# Patient Record
Sex: Female | Born: 1975 | Race: White | Hispanic: No | Smoking: Former smoker
Health system: Southern US, Community
[De-identification: ages and names within clinical notes are randomized; demographics above are authoritative.]

## PROBLEM LIST (undated history)

## (undated) DIAGNOSIS — D869 Sarcoidosis, unspecified: Secondary | ICD-10-CM

## (undated) DIAGNOSIS — I10 Essential (primary) hypertension: Secondary | ICD-10-CM

## (undated) DIAGNOSIS — F988 Other specified behavioral and emotional disorders with onset usually occurring in childhood and adolescence: Secondary | ICD-10-CM

## (undated) DIAGNOSIS — F419 Anxiety disorder, unspecified: Secondary | ICD-10-CM

## (undated) HISTORY — DX: Anxiety disorder, unspecified: F41.9

## (undated) HISTORY — DX: Essential (primary) hypertension: I10

## (undated) HISTORY — DX: Sarcoidosis, unspecified: D86.9

## (undated) HISTORY — DX: Other specified behavioral and emotional disorders with onset usually occurring in childhood and adolescence: F98.8

---

## 1997-05-05 ENCOUNTER — Other Ambulatory Visit: Admission: RE | Admit: 1997-05-05 | Discharge: 1997-05-05 | Payer: Self-pay | Admitting: Obstetrics and Gynecology

## 1997-12-10 ENCOUNTER — Inpatient Hospital Stay (HOSPITAL_COMMUNITY): Admission: AD | Admit: 1997-12-10 | Discharge: 1997-12-10 | Payer: Self-pay | Admitting: Obstetrics and Gynecology

## 1998-01-08 ENCOUNTER — Inpatient Hospital Stay (HOSPITAL_COMMUNITY): Admission: AD | Admit: 1998-01-08 | Discharge: 1998-01-09 | Payer: Self-pay | Admitting: Obstetrics & Gynecology

## 1998-05-26 ENCOUNTER — Other Ambulatory Visit: Admission: RE | Admit: 1998-05-26 | Discharge: 1998-05-26 | Payer: Self-pay | Admitting: Obstetrics and Gynecology

## 1999-08-01 ENCOUNTER — Other Ambulatory Visit: Admission: RE | Admit: 1999-08-01 | Discharge: 1999-08-01 | Payer: Self-pay | Admitting: Obstetrics and Gynecology

## 2001-04-08 ENCOUNTER — Encounter: Admission: RE | Admit: 2001-04-08 | Discharge: 2001-04-08 | Payer: Self-pay

## 2001-08-08 ENCOUNTER — Other Ambulatory Visit: Admission: RE | Admit: 2001-08-08 | Discharge: 2001-08-08 | Payer: Self-pay | Admitting: Family Medicine

## 2002-03-24 ENCOUNTER — Encounter: Payer: Self-pay | Admitting: Neurology

## 2002-03-24 ENCOUNTER — Ambulatory Visit (HOSPITAL_COMMUNITY): Admission: RE | Admit: 2002-03-24 | Discharge: 2002-03-24 | Payer: Self-pay | Admitting: Neurology

## 2002-05-02 ENCOUNTER — Encounter: Payer: Self-pay | Admitting: Emergency Medicine

## 2002-05-02 ENCOUNTER — Emergency Department (HOSPITAL_COMMUNITY): Admission: EM | Admit: 2002-05-02 | Discharge: 2002-05-03 | Payer: Self-pay | Admitting: Emergency Medicine

## 2002-06-09 ENCOUNTER — Encounter: Admission: RE | Admit: 2002-06-09 | Discharge: 2002-06-09 | Payer: Self-pay | Admitting: Obstetrics and Gynecology

## 2002-07-09 ENCOUNTER — Inpatient Hospital Stay (HOSPITAL_COMMUNITY): Admission: AD | Admit: 2002-07-09 | Discharge: 2002-07-09 | Payer: Self-pay | Admitting: Obstetrics and Gynecology

## 2002-07-28 ENCOUNTER — Inpatient Hospital Stay (HOSPITAL_COMMUNITY): Admission: AD | Admit: 2002-07-28 | Discharge: 2002-07-30 | Payer: Self-pay | Admitting: Obstetrics and Gynecology

## 2002-07-28 ENCOUNTER — Encounter (INDEPENDENT_AMBULATORY_CARE_PROVIDER_SITE_OTHER): Payer: Self-pay | Admitting: Specialist

## 2002-09-03 ENCOUNTER — Other Ambulatory Visit: Admission: RE | Admit: 2002-09-03 | Discharge: 2002-09-03 | Payer: Self-pay | Admitting: Obstetrics and Gynecology

## 2002-12-28 ENCOUNTER — Ambulatory Visit (HOSPITAL_COMMUNITY): Admission: RE | Admit: 2002-12-28 | Discharge: 2002-12-28 | Payer: Self-pay | Admitting: Family Medicine

## 2003-09-24 ENCOUNTER — Other Ambulatory Visit: Admission: RE | Admit: 2003-09-24 | Discharge: 2003-09-24 | Payer: Self-pay | Admitting: Obstetrics and Gynecology

## 2003-09-27 ENCOUNTER — Encounter: Admission: RE | Admit: 2003-09-27 | Discharge: 2003-09-27 | Payer: Self-pay | Admitting: Obstetrics and Gynecology

## 2005-03-20 ENCOUNTER — Ambulatory Visit: Payer: Self-pay | Admitting: Pulmonary Disease

## 2005-03-23 ENCOUNTER — Ambulatory Visit: Payer: Self-pay | Admitting: Pulmonary Disease

## 2005-03-26 ENCOUNTER — Ambulatory Visit: Payer: Self-pay | Admitting: Pulmonary Disease

## 2005-03-26 ENCOUNTER — Encounter (INDEPENDENT_AMBULATORY_CARE_PROVIDER_SITE_OTHER): Payer: Self-pay | Admitting: Specialist

## 2005-03-26 ENCOUNTER — Encounter (INDEPENDENT_AMBULATORY_CARE_PROVIDER_SITE_OTHER): Payer: Self-pay | Admitting: Pulmonary Disease

## 2005-03-26 ENCOUNTER — Ambulatory Visit (HOSPITAL_COMMUNITY): Admission: RE | Admit: 2005-03-26 | Discharge: 2005-03-26 | Payer: Self-pay | Admitting: Pulmonary Disease

## 2005-04-05 ENCOUNTER — Ambulatory Visit: Payer: Self-pay | Admitting: Internal Medicine

## 2005-04-16 ENCOUNTER — Ambulatory Visit: Payer: Self-pay | Admitting: Pulmonary Disease

## 2005-04-24 ENCOUNTER — Encounter (INDEPENDENT_AMBULATORY_CARE_PROVIDER_SITE_OTHER): Payer: Self-pay | Admitting: Specialist

## 2005-04-24 ENCOUNTER — Inpatient Hospital Stay (HOSPITAL_COMMUNITY): Admission: RE | Admit: 2005-04-24 | Discharge: 2005-04-27 | Payer: Self-pay | Admitting: Thoracic Surgery

## 2005-05-02 ENCOUNTER — Encounter: Admission: RE | Admit: 2005-05-02 | Discharge: 2005-05-02 | Payer: Self-pay | Admitting: Thoracic Surgery

## 2005-05-15 ENCOUNTER — Ambulatory Visit: Payer: Self-pay | Admitting: Pulmonary Disease

## 2005-07-27 ENCOUNTER — Encounter (HOSPITAL_COMMUNITY): Admission: RE | Admit: 2005-07-27 | Discharge: 2005-09-28 | Payer: Self-pay | Admitting: Neurology

## 2006-12-31 ENCOUNTER — Ambulatory Visit (HOSPITAL_COMMUNITY): Admission: RE | Admit: 2006-12-31 | Discharge: 2006-12-31 | Payer: Self-pay | Admitting: Obstetrics and Gynecology

## 2007-11-03 ENCOUNTER — Ambulatory Visit (HOSPITAL_COMMUNITY): Admission: RE | Admit: 2007-11-03 | Discharge: 2007-11-03 | Payer: Self-pay | Admitting: Family Medicine

## 2008-04-23 ENCOUNTER — Ambulatory Visit (HOSPITAL_COMMUNITY): Admission: RE | Admit: 2008-04-23 | Discharge: 2008-04-23 | Payer: Self-pay | Admitting: Obstetrics and Gynecology

## 2009-06-28 ENCOUNTER — Encounter
Admission: RE | Admit: 2009-06-28 | Discharge: 2009-06-28 | Payer: Self-pay | Admitting: Physical Medicine and Rehabilitation

## 2009-08-10 ENCOUNTER — Encounter
Admission: RE | Admit: 2009-08-10 | Discharge: 2009-08-10 | Payer: Self-pay | Admitting: Physical Medicine and Rehabilitation

## 2010-02-05 ENCOUNTER — Encounter: Payer: Self-pay | Admitting: Emergency Medicine

## 2010-02-05 ENCOUNTER — Encounter: Payer: Self-pay | Admitting: Family Medicine

## 2010-02-05 ENCOUNTER — Encounter: Payer: Self-pay | Admitting: Thoracic Surgery

## 2010-04-20 ENCOUNTER — Other Ambulatory Visit: Payer: Self-pay | Admitting: Oral Surgery

## 2010-04-20 DIAGNOSIS — M26629 Arthralgia of temporomandibular joint, unspecified side: Secondary | ICD-10-CM

## 2010-04-24 ENCOUNTER — Ambulatory Visit
Admission: RE | Admit: 2010-04-24 | Discharge: 2010-04-24 | Disposition: A | Discharge status: Home / Self Care | Payer: 59 | Source: Ambulatory Visit | Attending: Oral Surgery | Admitting: Oral Surgery

## 2010-04-24 DIAGNOSIS — M26629 Arthralgia of temporomandibular joint, unspecified side: Secondary | ICD-10-CM

## 2010-05-09 ENCOUNTER — Emergency Department (HOSPITAL_COMMUNITY): Payer: 59

## 2010-05-09 ENCOUNTER — Emergency Department (HOSPITAL_COMMUNITY)
Admission: EM | Admit: 2010-05-09 | Discharge: 2010-05-09 | Disposition: A | Discharge status: Home / Self Care | Payer: 59 | Attending: Emergency Medicine | Admitting: Emergency Medicine

## 2010-05-09 DIAGNOSIS — Z79899 Other long term (current) drug therapy: Secondary | ICD-10-CM | POA: Insufficient documentation

## 2010-05-09 DIAGNOSIS — M542 Cervicalgia: Secondary | ICD-10-CM | POA: Insufficient documentation

## 2010-05-09 DIAGNOSIS — D869 Sarcoidosis, unspecified: Secondary | ICD-10-CM | POA: Insufficient documentation

## 2010-05-09 DIAGNOSIS — L03211 Cellulitis of face: Secondary | ICD-10-CM | POA: Insufficient documentation

## 2010-05-09 DIAGNOSIS — R51 Headache: Secondary | ICD-10-CM | POA: Insufficient documentation

## 2010-05-09 DIAGNOSIS — L0201 Cutaneous abscess of face: Secondary | ICD-10-CM | POA: Insufficient documentation

## 2010-05-09 DIAGNOSIS — R220 Localized swelling, mass and lump, head: Secondary | ICD-10-CM | POA: Insufficient documentation

## 2010-05-09 LAB — POCT I-STAT, CHEM 8
BUN: 8 mg/dL (ref 6–23)
Calcium, Ion: 1.11 mmol/L — ABNORMAL LOW (ref 1.12–1.32)
Chloride: 106 mEq/L (ref 96–112)
Creatinine, Ser: 0.8 mg/dL (ref 0.4–1.2)
Glucose, Bld: 83 mg/dL (ref 70–99)
Potassium: 4.1 mEq/L (ref 3.5–5.1)

## 2010-05-09 LAB — CBC
Hemoglobin: 14.1 g/dL (ref 12.0–15.0)
MCH: 29.4 pg (ref 26.0–34.0)
MCHC: 34.8 g/dL (ref 30.0–36.0)
MCV: 84.4 fL (ref 78.0–100.0)
RDW: 12.6 % (ref 11.5–15.5)

## 2010-05-09 LAB — DIFFERENTIAL
Eosinophils Absolute: 0.3 10*3/uL (ref 0.0–0.7)
Eosinophils Relative: 2 % (ref 0–5)
Lymphocytes Relative: 16 % (ref 12–46)
Neutrophils Relative %: 73 % (ref 43–77)

## 2010-05-09 MED ORDER — IOHEXOL 300 MG/ML  SOLN
70.0000 mL | Freq: Once | INTRAMUSCULAR | Status: AC | PRN
  Administered 2010-05-09: 70 mL via INTRAVENOUS

## 2010-05-10 ENCOUNTER — Inpatient Hospital Stay (HOSPITAL_COMMUNITY)
Admission: AD | Admit: 2010-05-10 | Discharge: 2010-05-14 | DRG: 155 | Disposition: A | Discharge status: Home / Self Care | Payer: 59 | Source: Ambulatory Visit | Attending: Family Medicine | Admitting: Family Medicine

## 2010-05-10 DIAGNOSIS — L03211 Cellulitis of face: Secondary | ICD-10-CM | POA: Diagnosis present

## 2010-05-10 DIAGNOSIS — R000 Tachycardia, unspecified: Secondary | ICD-10-CM | POA: Diagnosis present

## 2010-05-10 DIAGNOSIS — K1120 Sialoadenitis, unspecified: Principal | ICD-10-CM | POA: Diagnosis present

## 2010-05-10 DIAGNOSIS — R042 Hemoptysis: Secondary | ICD-10-CM | POA: Diagnosis present

## 2010-05-10 DIAGNOSIS — F17200 Nicotine dependence, unspecified, uncomplicated: Secondary | ICD-10-CM | POA: Diagnosis present

## 2010-05-10 DIAGNOSIS — D869 Sarcoidosis, unspecified: Secondary | ICD-10-CM | POA: Diagnosis present

## 2010-05-10 DIAGNOSIS — K5900 Constipation, unspecified: Secondary | ICD-10-CM | POA: Diagnosis not present

## 2010-05-10 DIAGNOSIS — Z79899 Other long term (current) drug therapy: Secondary | ICD-10-CM

## 2010-05-10 DIAGNOSIS — L0201 Cutaneous abscess of face: Secondary | ICD-10-CM | POA: Diagnosis present

## 2010-05-10 LAB — CBC
HCT: 40.1 % (ref 36.0–46.0)
Hemoglobin: 13.8 g/dL (ref 12.0–15.0)
MCH: 29.4 pg (ref 26.0–34.0)
MCHC: 34.4 g/dL (ref 30.0–36.0)
RBC: 4.69 MIL/uL (ref 3.87–5.11)

## 2010-05-10 LAB — BASIC METABOLIC PANEL
CO2: 24 mEq/L (ref 19–32)
Calcium: 8.5 mg/dL (ref 8.4–10.5)
Chloride: 104 mEq/L (ref 96–112)
Creatinine, Ser: 0.68 mg/dL (ref 0.4–1.2)
Glucose, Bld: 95 mg/dL (ref 70–99)
Sodium: 136 mEq/L (ref 135–145)

## 2010-05-11 ENCOUNTER — Encounter (HOSPITAL_COMMUNITY): Payer: Self-pay | Admitting: Radiology

## 2010-05-11 ENCOUNTER — Inpatient Hospital Stay (HOSPITAL_COMMUNITY): Payer: 59

## 2010-05-11 LAB — BASIC METABOLIC PANEL
BUN: 5 mg/dL — ABNORMAL LOW (ref 6–23)
CO2: 24 mEq/L (ref 19–32)
Calcium: 8.4 mg/dL (ref 8.4–10.5)
Creatinine, Ser: 0.69 mg/dL (ref 0.4–1.2)
GFR calc non Af Amer: >60 mL/min (ref >60)
Glucose, Bld: 151 mg/dL — ABNORMAL HIGH (ref 70–99)
Sodium: 138 mEq/L (ref 135–145)

## 2010-05-11 LAB — CBC
HCT: 35.8 % — ABNORMAL LOW (ref 36.0–46.0)
Hemoglobin: 12.3 g/dL (ref 12.0–15.0)
MCH: 29.4 pg (ref 26.0–34.0)
MCHC: 34.4 g/dL (ref 30.0–36.0)
RDW: 12.7 % (ref 11.5–15.5)

## 2010-05-11 MED ORDER — IOHEXOL 300 MG/ML  SOLN
75.0000 mL | Freq: Once | INTRAMUSCULAR | Status: AC | PRN
  Administered 2010-05-11: 75 mL via INTRAVENOUS

## 2010-05-12 LAB — BASIC METABOLIC PANEL
CO2: 25 mEq/L (ref 19–32)
Calcium: 7.9 mg/dL — ABNORMAL LOW (ref 8.4–10.5)
Creatinine, Ser: 0.72 mg/dL (ref 0.4–1.2)
GFR calc Af Amer: >60 mL/min (ref >60)
GFR calc non Af Amer: >60 mL/min (ref >60)
Sodium: 138 mEq/L (ref 135–145)

## 2010-05-12 LAB — CBC
Hemoglobin: 11.6 g/dL — ABNORMAL LOW (ref 12.0–15.0)
MCH: 28.4 pg (ref 26.0–34.0)
MCHC: 33.3 g/dL (ref 30.0–36.0)
Platelets: 282 10*3/uL (ref 150–400)
RDW: 12.5 % (ref 11.5–15.5)

## 2010-05-13 LAB — VANCOMYCIN, TROUGH: Vancomycin Tr: 8.4 ug/mL — ABNORMAL LOW (ref 10.0–20.0)

## 2010-05-14 LAB — CBC
MCV: 85.8 fL (ref 78.0–100.0)
Platelets: 245 10*3/uL (ref 150–400)
RBC: 3.52 MIL/uL — ABNORMAL LOW (ref 3.87–5.11)
RDW: 12.6 % (ref 11.5–15.5)
WBC: 8.5 10*3/uL (ref 4.0–10.5)

## 2010-05-17 LAB — CULTURE, BLOOD (ROUTINE X 2): Culture  Setup Time: 201204260357

## 2010-05-19 NOTE — Discharge Summary (Signed)
NAMEROSANA, FARNELL NO.:  [id number]  MEDICAL RECORD NO.:  [id number]           PATIENT TYPE:  I  LOCATION:  4505                         FACILITY:  [facility]  PHYSICIAN:  Leighton Roach McDiarmid, M.D.DATE OF BIRTH:  [birth year]  DATE OF ADMISSION:  05/10/2010 DATE OF DISCHARGE:  05/14/2010                              DISCHARGE SUMMARY   PRIMARY CARE PROVIDER:  Dr. Cleta Alberts at [facility].  DISCHARGE DIAGNOSES: 1. Right submandibular sialoadenitis. 2. Sarcoidosis.  DISCHARGE MEDICATIONS: 1. Augmentin 875 mg p.o. b.i.d. times 6 days. 2. MiraLax 1 tablet p.o. daily p.r.n. 3. Cyclobenzaprine 10 mg p.o. t.i.d. 4. Hydrocodone/acetaminophen 5/500 1-2 tablets p.o. q.4-6 hours p.r.n.     pain. 5. Morphine sulfate IR 30 mg p.o. q.4 h. p.r.n. pain. 6. Nabumetone 500 mg p.o. b.i.d. 7. Ortho Tri-Cyclen 1 tablet p.o. daily.  CONSULTS:  Dr. Ezzard Standing with Ears, Nose, and Throat.  PROCEDURES:1. Chest x-ray on April 26, showing no acute abnormalities. 2. Maxillofacial CT showing swelling of the right side of tongue,     right submandibular gland, right parapharyngeal region with edema     in the fat planes and reactive regional lymph nodes.  No sign of     drainable abscess or growth progression.  LABORATORY DATA:  On admission, the patient's CBC showed a white count of 13.5, hemoglobin of 14.3, platelets of 313, ANC count of 9.9.  Her BMET was within normal limits with a creatinine of 0.8.  Blood cultures were drawn and were negative at the time of dictation.  CBC on the day of discharge had improved to a white count of 8.5, hemoglobin of 10.1 and platelets of 245.  BRIEF HOSPITAL COURSE:  This is a 35 year old female with known past medical history of sarcoidosis, presenting with right submandibular swelling with surrounding cellulitis. 1. Right submandibular sialoadenitis.  Initially on admission, the     patient was started on IV vancomycin and Zosyn.  CT head and  neck     showed no drainable abscesses, which was similar to the CT obtained     in the emergency department on April 24 when the patient initially     presented for this right submandibular gland swelling.  The patient     was febrile on admission to 101.5.  T-max during hospitalization     was 102.7.  The patient was afebrile 24 hours prior to discharge.     The patient was continued on IV vancomycin and Zosyn and was given     scheduled Toradol as well as p.r.n. Dilaudid and Tylenol since she     was in significant pain.  Initially was tolerating clears only as     it was difficult for the patient to swallow.  Voice was initially     very garbled, however, this had also improved prior to the day of     discharge and surrounding swelling improved on the right side of     her neck.  ENT was consulted and stated that it appear to be a     right submandibular sialoadenitis with surrounding cellulitis and  they agreed that there was no pocket of infection to drain and/or     culture as per the CT scan.  They recommended continuing broad-     spectrum antibiotics as the most likely organism to be causing this     sialoadenitis and surrounding cellulitis would be staph.  On the     day of discharge, the patient was stating that she felt much better     and was asking to go home.  She was transitioned to p.o. Augmentin     to continue an antibiotic course for treatment of this acute     infection.  Again, the patient had been afebrile for greater than     24 hours with a normalized white blood cell count and improvement     in pain. 2. Sarcoidosis.  The patient did have two episode of hemoptysis prior     to admission and again had one small episode of hemoptysis while in-     house on her first night here.  Repeat chest x-ray showed no active     disease and the patient had no oxygen requirement in her increased     work of breathing, therefore treatment for possible sarcoid flare     was  not started.  No steroids were started.  The patient was     continued on continuous pulse ox in order to assess the need for     oxygen.  Pulmonology was consulted on Sunday April 29 and had     agreed to see the patient on Monday April 60f and they felt that     this could be a sarcoidosis flare, however, the patient strongly     wished to leave on April 29, and thus will follow up with     Pulmonology as an outpatient to decide if any treatment needs to be     done at this time. 3. Tachycardia.  Initially on admission, the patient was tachycardic     into the 120s, however, with pain control and rehydration with IV     fluids.  The patient's tachycardia improved and she was however had     a heart rate in the 70s on the day of discharge.  DISCHARGE INSTRUCTIONS:  The patient was instructed to increase activity slowly, p.o. ad lib as tolerated, insuring that she is getting adequate fluids.  FOLLOWUP: 1. Dr. Cleta Alberts at [facility].  The patient is to call for an     appointment this week. 2. Dr. Ezzard Standing with ENT.  Dr. Allene Pyo office will call with a follow-     up appointment with the patient. 3. Pulmonologist.  The patient is to call her pulmonologist for a     follow-up appointment in the next 1-2 weeks for her sarcoidosis.  DISCHARGE CONDITION:  The patient was discharged home with her husband in stable medical condition, and given warning signs to return to the emergency department for.    ______________________________ Demetria Pore, MD   ______________________________ Leighton Roach McDiarmid, M.D.    JM/MEDQ  D:  05/15/2010  T:  05/16/2010  Job:  [id number]  cc:   Brett Canales A. Cleta Alberts, M.D. Kristine Garbe. Ezzard Standing, M.D.  Electronically Signed by Demetria Pore MD on 05/17/2010 11:43:39 AM Electronically Signed by Acquanetta Belling M.D. on 05/19/2010 11:33:01 AM

## 2010-05-29 NOTE — H&P (Signed)
[name], [name] NO.:  [id number]  MEDICAL RECORD NO.:  [id number]           PATIENT TYPE:  I  LOCATION:  4505                         FACILITY:  [facility]  PHYSICIAN:  Stephanie Compton, MD        DATE OF BIRTH:  Jun 30, 1975  DATE OF ADMISSION:  05/10/2010 DATE OF DISCHARGE:                             HISTORY & PHYSICAL   TIME OF ADMISSION:  10 p.m.  PRIMARY CARE PHYSICIAN:  Stephanie Canales A. Cleta Alberts, MD at [facility].  ORAL AND MAXILLOFACIAL SURGEON:  Stephanie Lopes, MD  PULMONOLOGIST:  At [facility], last visit was 2-3 years ago.  CHIEF COMPLAINT:  Right submandibular swelling and hemoptysis.  HISTORY OF PRESENT ILLNESS:  The patient is a 35 year old female with a known history of sarcoidosis who presents with a 5-day history of submandibular pain and swelling.  She was evaluated in the ED yesterday as the pain and swelling had increased from small 2 x 2 cm to 3 x 4 cm mass by yesterday.  She was found to have a slightly elevated white count at 13.5 and maxillofacial CT result right perimandibular cellulitis that was removed from the TMJ and no evidence of osteo or significant periodontal disease.  She was given clindamycin and Vicodin for treatment of sialadenitis and discharged to home from the ED.  The patient presented to her PCP this morning with complaint of nickel-sized hemoptysis x3 episodes.  She describes coughing up blood clots this morning.  Chest x-ray obtained in the clinic revealed perihilar adenopathy versus infiltrate concerning for sarcoidosis flare.  Of note, the patient has recently had a right TMJ arthroscopy with aspiration 1 week ago.  ALLERGIES:  LYRICA causes patient to have hallucinations.  MEDICATIONS:  Ortho-Tri-Cyclen, and Vicodin.  PAST MEDICAL HISTORY: 1. Sarcoidosis diagnosed in 2007. 2. Right TMJ arthrocentesis last Wednesday.  PAST SURGICAL HISTORY:  Failed BPO in 2008.  SOCIAL HISTORY:  The patient lives with her  husband and two children; ages 58 and 67.  She works as an [employer] fundraiser.  Tobacco;  one pack per day since age 23.  Alcohol, one glass of wine socially.  No drug use.  FAMILY HISTORY:  Her mother is deceased.  Father is deceased.  Siblings noncontributory.  The patient has no family history of respiratory disease or vascular disease.  REVIEW OF SYSTEMS:  GENERAL:  The patient has fever, chills, decreased appetite, fatigue, 5-pound weight loss.  No sweats.  HEENT:  She denies HEENT symptoms.  CARDIAC:  Denies cardiovascular symptoms.  No chest pain.  No edema.  No palpitations.  RESPIRATORY:  She reports cough, but no dyspnea.  No wheezing.  No sputum production.  She reports hemoptysis, three episodes this morning, now resolved.  GI:  The patient denies GI symptoms.  GU:  Denies.  SKIN:  Denies.  MUSCULOSKELETAL: Denies.  NEURO:  Denies.  HEME:  Denies.  ENDO:  Denies.  PHYSICAL EXAMINATION:  VITAL SIGNS:  Temperature 100.2, pulse 120, respiratory rate 18, blood pressure 113/75, saturating 92% on room air. GENERAL:  The patient is awake, alert, no acute distress.  She has muffled speech. HEENT:  Normocephalic and atraumatic.  Pupils equal, round, and reactive to light and accommodating.  Tympanic membranes were without erythema or edema bilaterally.  Mouth opens 2.5 cm.  She has 3 x 5 cm right submandibular swelling without erythema.  It is tender to palpation, localized.  She has dry mucous membranes.  No oral lesions.  Her tongue does appear cracked and possibly swollen.  She has on bimanual exam, there is no tenderness of buccal mucosa. NECK:  Supple, full range of motion.  No cervical lymphadenopathy. CARDIOVASCULAR:  S1 and S2, regular rate and rhythm.  No murmurs, rubs, or gallops. LUNGS:  Normal work of breathing, scattered expiratory wheezes bilaterally.  No crackles. No rhonchi. ABDOMEN:  Normoactive bowel sounds, soft, nontender. EXTREMITIES:  She has 5/5 strength in bilateral upper  extremities. NEUROLOGIC:  Cranial nerves II through XII are intact.  Normal muscle tone.  The patient has no labs or studies.  No labs from today.  Her CMET yesterday was obtained in the ED and was within normal limits.  CBC was only notable for a white count of 13.5, ANC of 9.9.  Chest x-ray obtained at [facility] shows no perihilar infiltrate.  ASSESSMENT AND PLAN:  A 34 year old female with a known history of sarcoidosis presenting with 5 days of right submandibular swelling and 1- day history of hemoptysis. 1. Hemoptysis, resolved.  The patient does have new infiltrates on     chest x-ray concerning for sarcoidosis flare, but since her     respiratory status is stable with normal work of breathing, normal     O2 sat, I will hold off starting steroid course.  Plan to closely     monitor the patient with continued pulse ox, may give p.r.n.     albuterol nebs.  The patient has increased wheezing. 2. Right submandibular swelling, likely sialadenitis secondary to     bacterial infection given the patient has an increased white blood     cell count with neutrophils predominance.  Plan to obtain blood     cultures x2 and start IV vanc and Zosyn.  I have also considered     extrapulmonary sarcoidosis, but this is unlikely because     extrapulmonary sarcoidosis with facial gland swelling is usually     characterized by bilateral pain with swelling of the parotid gland.     There is no evidence of osteo or right TMJ involvement on the     patient's CT scan given that she does have recent manipulation of     that area by OMFS last week.  Plan is to treat with IV vanc and     Zosyn, start morphine and Toradol for pain control. 3. Fluids, electrolytes, nutrition/gastrointestinal.  We will check     the patient's electrolytes today.  We will start clear liquid diet     and IV fluids given the patient's losses and her poor p.o. intake     over the last few days and to     make up for  poor p.o. intake over the last few days. 4. Deep venous thrombosis prophylaxis.  Lovenox 40 subcu daily. 5. Disposition.  Pending clinical improvement, the patient will be     discharged to home.    ______________________________ Dessa Phi, MD   ______________________________ Stephanie Compton, MD    JF/MEDQ  D:  05/10/2010  T:  05/11/2010  Job:  [id number]  Electronically Signed by Dessa Phi MD on 05/26/2010 03:27:49 PM Electronically Signed by  Stephanie Compton MD on 05/29/2010 04:39:13 PM

## 2010-05-30 NOTE — Op Note (Signed)
[name], [name] NO.:  [id number]   MEDICAL RECORD NO.:  [id number]          PATIENT TYPE:  AMB   LOCATION:  SDC                           FACILITY:  [facility]   PHYSICIAN:  Randye Lobo, M.D.   DATE OF BIRTH:  [birth year]   DATE OF PROCEDURE:  12/31/2006  DATE OF DISCHARGE:                               OPERATIVE REPORT   PREOPERATIVE DIAGNOSIS:  Multiparous female, desires permanent  sterilization.   POSTOPERATIVE DIAGNOSIS:  Multiparous female, desires permanent  sterilization.   PROCEDURE:  Laparoscopic bilateral tubal ligation by fulguration.   SURGEON:  Conley Simmonds, M.D.   ANESTHESIA:  General endotracheal, local with 0.25% Marcaine.   IV FLUIDS:  2000 mL Ringer's lactate.   ESTIMATED BLOOD LOSS:  Minimal.   URINE OUTPUT:  350 mL.   COMPLICATIONS:  None.   INDICATIONS FOR PROCEDURE:  The patient is a 35 year old para 2  Caucasian female who desires permanent sterilization.  The plan is to  proceed with a laparoscopic bilateral tubal ligation with bipolar  cautery after risks, benefits, and alternatives are reviewed.  The  patient understands that there is a failure rate of the procedure of  approximately 1 in 250 to 1 in 300 which may result in either an  intrauterine or an ectopic pregnancy.   FINDINGS:  The patient had a normal appearing uterus, bilateral tubes,  bilateral ovaries, liver, gallbladder, stomach organ, and appendix.  There was no evidence of any adhesive disease nor endometriosis in the  abdomen or the pelvis.   SPECIMENS:  None.   PROCEDURE:  The patient was reidentified in the preoperative hold area.  She did receive Ancef 1 gram IV for antibiotic prophylaxis.  In the  operating room, general endotracheal anesthesia was induced, and the  patient was then placed in the dorsal lithotomy position.  The abdomen  and vagina were sterilely prepped and the bladder was catheterized of  urine.  A speculum was placed inside the  vagina, and a single-tooth  tenaculum was placed on the anterior cervical lip.  This was replaced  with a Hulka tenaculum, and the remaining vaginal instruments were  removed.  The patient was then sterilely draped.   The procedure began by creating a 1-cm umbilical incision with a  scalpel.  The incision was carried down to the fascia using blunt  dissection with an Allis clamp.  A 10-mm trocar was then inserted  directly into the peritoneal cavity without difficulty.  The laparoscope  confirmed proper placement.  A CO2 pneumoperitoneum was achieved, and  the patient was then placed in the Trendelenburg position.   A 5-mm suprapubic incision was created 2 fingerbreadths above the pubic  symphysis.  A 5-mm trocar was inserted directly under visualization of  the laparoscope.  An inspection of the pelvic and abdominal organs was  performed, and the findings are as noted above.   The bipolar Kleppinger forceps was then used to grasp the right  fallopian tube and followed all the way to its fimbriated end.  It was  then grasped along the isthmic portion and was fulgurated over a  total  distance of 3 cm.  There was good burn effect into the mesosalpinx.  The  same procedure that was performed on the right fallopian tube was then  repeated on the left fallopian tube after it was grasped and followed  all the way to its fimbriated end.  Again, a 3-cm segment of fallopian  tube including mesosalpinx was fulgurated.   This concluded the patient's procedure.  The 5-mm suprapubic trocar was  removed under visualization of the laparoscope.  The CO2  pneumoperitoneum was released, and then the 10-mm umbilical trocar and  laparoscope were removed simultaneously.   The skin incisions were closed with inverted subcuticular sutures of 3-0  plain gut suture.  The skin was then locally injected with 0.25%  Marcaine at each of the incision sites.  The incisions were covered  sterilely.   The Hulka  tenaculum was removed.  The patient was awakened and extubated  and escorted to the recovery room in stable and awake condition.  There  were no complications to the procedure.  All needle, instrument, and  sponge counts were correct.      Randye Lobo, M.D.  Electronically Signed     BES/MEDQ  D:  12/31/2006  T:  12/31/2006  Job:  [id number]

## 2010-06-02 NOTE — Op Note (Signed)
NAMESHEREESE, [name] NO.:  [id number]   MEDICAL RECORD NO.:  [id number]          PATIENT TYPE:  AMB   LOCATION:  ENDO                         FACILITY:  [facility]   PHYSICIAN:  Danice Goltz, M.D. LHCDATE OF BIRTH:  [birth year]   DATE OF PROCEDURE:  03/26/2005  DATE OF DISCHARGE:                                 OPERATIVE REPORT   PROCEDURE:  Bronchoscopy.   HISTORY:  This is a 35 year old white female who has been evaluated at  [facility] as recently as March 20, 2005, with subsequent follow-up  on March 23, 2005.  The patient has been kindly referred by Dr. Earl Lites  for evaluation of hemoptysis and abnormal CT.  The patient was found to have  bilateral pulmonary infiltrates. 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 She was given a prednisone taper as well  as Levaquin therapy.  Because of lack of resolution of her chest  x-ray, we  proceeded with bronchoscopy.   CONSENT AND PREOPERATIVE PREPARATION:  Bronchoscopy was discussed with the  patient including limitations, potential complications and benefits of the  same; she agreed to proceed. After informed consent was signed, the patient  was taken to the endoscopy suite where she was premedicated with a total of  7 mg of Versed and 100 mcg of fentanyl IV. The patient also received  lidocaine via bronchial lavage.  She also had Cetacaine local anesthetic as  well.   DESCRIPTION OF PROCEDURE:  The bronchoscope was then advanced via the oral  route. The vocal cords had evidence of reflux injury, with no inflammatory  changes suggestive of the same.  The trachea had diffuse inflammation with  mild pinpoint hemorrhage. The carina was sharp.  At this point lidocaine was  instilled in both mainstem bronchi, and on the individual subsegments..  Entering the right mainstem bronchus, the right upper lobe was noted to have  no endobronchial lesions. She had chronic diffuse  inflammation, and numerous  small mucous plugs and mucoid secretions. The bronchoscope was then advanced  on the right middle lobe and right lower lobe subsegments, and again the  same findings were noted. At this point, the bronchoscope was brought up  through the left, and the exact same findings were noted on the left upper  lobe, lingula and lower lobe subsegments. There was no obvious source of  true hemoptysis, with the exception of the inflammatory changes noted above.  At this point, the bronchoscope was brought up through the right middle  lobe, where lavage was done.  An initial 10 cc aliquot was done with normal  saline; this was suctioned, traps were changed, and subsequent lavage was  done with 2 aliquots of 20 cc of normal saline -- yielding 25 cc of BAL. At  this point, the trap was changed and saved for pathology and T-cell subset.   At this point, the bronchoscope was brought to the right lower lobe  subsegments, and under fluoroscopy the patient had 4 transbronchial biopsies  done; with no obvious complications noted. The chest x-ray at this point is  pending.   Blood loss was negligible.   After proper hemostasis was noted, the bronchoscope was pulled out and the  procedure was terminated. The patient was taken to the recovery area in  satisfactory condition.   IMPRESSION:  HEMOPTYSIS AND BILATERAL PULMONARY INFILTRATES.  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  The Plan will  be to await pathology and cultures.  The patient's bronchial washings were  also checked and sent for T-cell subsets.  We will await the results of  these.   FOLLOW-UP:  Will be as previously scheduled in 2-3 days time a [facility]           ______________________________  Danice Goltz, M.D. [facility]     LG/MEDQ  D:  03/26/2005  T:  03/27/2005  Job:  [id number]   cc:   Brett Canales A. Cleta Alberts, M.D.   Fax: [phone]

## 2010-06-02 NOTE — Discharge Summary (Signed)
[name], [name] NO.:  [id number]   MEDICAL RECORD NO.:  [id number]          PATIENT TYPE:  INP   LOCATION:  3304                         FACILITY:  [facility]   PHYSICIAN:  Ines Bloomer, M.D. DATE OF BIRTH:  [birth year]   DATE OF ADMISSION:  04/24/2005  DATE OF DISCHARGE:  04/26/2005                                 DISCHARGE SUMMARY   PRIMARY DIAGNOSIS:  Right upper lobe, left lower lobe and left upper lobe  infiltrates; shows granulomatous inflammation, __________ granulomas typical  of sarcoidosis.   IN HOSPITAL OPERATIONS AND PROCEDURES:  Left video-assisted thoracoscopic  surgery with lung biopsy x3.   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  Chest x-ray  showed bilateral pulmonary infiltrates.  She was seen and CT scan was then  ordered, which showed mediastinal and hilar adenopathy with extensive  infiltrates to the left lower lobe, right middle lobe and right upper lobe,  and was relatively sparing of the lingula and right middle lobe.  A small  effusion was seen on the right.  The patient was started on steroids and  antibiotics at that time.  Repeat chest x-ray did show some involvement in  the lingula.  Bronchoscopy was then done, which was nondiagnostic.  Followup  chest x-ray then showed some partial clearing, but still has a left lower  lobe infiltrate.  The patient was seen and evaluated by Dr. Edwyna Shell.  Dr.  Edwyna Shell discussed with patient taking her to the operating room to undergo a  VATS, and biopsy of the left lung and possible right lung.  He discussed the  risks and benefits of this with the patient.  The patient acknowledged her  understanding and agreed to proceed.  Surgery was scheduled for April 24, 2005.   The patient was taken to the operating room on April 24, 2005 where she  underwent left video-assisted thoracoscopic surgery with lung  biopsy x3.  Pathology results came back showing granulomatous inflammation with pattern  of granulomas typical of sarcoidosis.  Following surgery, the patient was  transferred to the intensive care unit.  She tolerated the procedure well.  Following surgery, she was seen to be alert and oriented x3, and neuro  intact.  The patient's postoperative course was pretty much unremarkable.  Chest tubes were discontinued posterior day 1 and anterior postop day 2.  Follow-up chest x-ray after second chest tubediscontinuedshowed to be  stable.  No pneumothorax noted.  The patient was encouraged to use her  incentive spirometer daily.  Vital signs were monitored and remained stable  postoperatively.  She was able to be weaned off oxygen satting greater than  90% on room air.  The patient was seen to be hemodynamically stable  following surgery with hemoglobin and hematocrit to 11 and 34.  The  patient's incisions were dry, intact and healing well.  Postoperatively, she  was out of bed and ambulating well.  She was tolerating a regular diet well  with no nausea or vomiting.  The patient's  pain was slightly controlled with  p.o. medications.   The patient is tentatively ready for discharge home on postop day 3, April 27, 2005.  The patient is in stable condition at discharge.  A follow-up  appointment has been scheduled with Dr. Edwyna Shell for May 02, 2005 at 3:50  p.m.  The patient is to obtain an AP and lateral chest x-ray 1 hour prior to  this appointment.  Ms. Maines received instructions on diet, activity level  and incisional care.  She was told no driving until released to do so and no  heavy lifting over 10 pounds.  The patient is told she is allowed to shower,  washing her incisions using soap and water.  She is to contact the office if  she develops any drainage or opening from any of her incision sites.  The  patient was told to ambulate 3-4 times per day, progress as tolerated and to   continue her breathing exercises.  She was educated on diet to be low-fat  and low-salt.  The patient acknowledged her understanding.   DISCHARGE MEDICATIONS:  1.  Percocet 5/325 mg 1-2 tabs q.4-6h. p.r.n. pain.  2.  Provigil 400 mg daily.  3.  Nicotine patch over-the-counter.  4.  Ortho Cyclen daily.      Theda Belfast, PA    ______________________________  Ines Bloomer, M.D.    KMD/MEDQ  D:  04/26/2005  T:  04/26/2005  Job:  [id number]

## 2010-06-02 NOTE — Op Note (Signed)
NAMERISHITA, PETRON NO.:  [id number]   MEDICAL RECORD NO.:  [id number]           PATIENT TYPE:   LOCATION:                               FACILITY:  [facility]   PHYSICIAN:  Genene Churn. Love, M.D.    DATE OF BIRTH:  Apr 30, 1975   DATE OF PROCEDURE:  07/27/2005  DATE OF DISCHARGE:                                 OPERATIVE REPORT   PROCEDURE NOTE:  The patient was prepped and draped in the left lateral  position using Betadine and 1% Xylocaine.  The L3-L4 interspace was entered  without difficulty.  Opening pressure was 250, but the patient was anxious.  CSF was obtained and sent for studies; this was clear and colorless.  Studies to be obtained are RPR, protein, glucose, cell count, differential,  angiotensin-converting enzyme, IgG and oligoclonal IgG.  The patient  tolerated the procedure well.           ______________________________  Genene Churn. Sandria Manly, M.D.     JML/MEDQ  D:  07/27/2005  T:  07/27/2005  Job:  [id number]

## 2010-06-02 NOTE — H&P (Signed)
[name], ESPEY NO.:  [id number]   MEDICAL RECORD NO.:  [id number]          PATIENT TYPE:  INP   LOCATION:  NA                           FACILITY:  [facility]   PHYSICIAN:  Ines Bloomer, M.D. DATE OF BIRTH:  11-01-1975   DATE OF ADMISSION:  DATE OF DISCHARGE:                                HISTORY & PHYSICAL   CHIEF COMPLAINT:  Pulmonary infiltrates.   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 Chest x-ray showed bilateral pulmonary  infiltrates. She was seen and CT scan was done which showed mediastinal and  hilar adenopathy with extensive infiltrates of the left lower lobe, right  middle lobe and right upper lobe and was relatively sparing of the lingula  and right middle lobe. A small effusion was seen on the right. She was  started on steroids and antibiotics.  Repeat chest x-ray did show some  involvement of the lingula. A bronchoscopy was done and was non diagnostic.  A follow up chest x-ray did show some partial clearing but still had the  left lower lobe infiltrate and has been admitted for left lung biopsy before  continuing either antibiotics or Prednisone.   ALLERGIES:  None.   MEDICATIONS:  Ortho-Cyclen daily and __________  50 mg daily.   FAMILY HISTORY:  Negative for vascular disease and cancer.   PAST MEDICAL HISTORY:  No major medical problems.   SOCIAL HISTORY:  She is married and has two children. Works as an [employer]  Smokes 1/4 pack of cigarettes a day. Does not drink alcohol.   REVIEW OF SYSTEMS:  She has some recent weight loss and loss of appetite and  some intermittent fevers. Weight is 182 pounds. She is 5 feet, 5 inches.  CARDIAC:  Denies angina or atrial fibrillation. PULMONARY: She has had a  cough, has also had bronchitis and hemoptysis. No wheezing. GI:  She has  been treated for a hiatal hernia and peptic ulcer disease. GU: She denies  dysuria, frequency or  kidney disease. VASCULAR:  Denies claudication, DVT or  TIA.  NEUROLOGIC:  She has chronic headaches. No black outs or seizure.  ORTHOPEDIC:  Denies joint pain.  PSYCHIATRIC:  No psychiatric illness. ENT:  She has photosensitivity but no hearing problems. HEMATOLOGY:  She has no  problem with bleeding or anemia.   PHYSICAL EXAMINATION:  GENERAL:  She is a well-developed Caucasian female in  no acute distress.  VITAL SIGNS:  Pulmonary function tests showed an FVC of 2.87, FEV1 of 2.62.  Blood pressure is 118/74, pulse 100, respirations are 18 and saturations are  96%.  HEENT:  Head is atraumatic. Eyes - pupils are equal, round and reactive to  light and accommodation. Extraocular movements are normal. Ears - tympanic  membranes intact. Nose with no septal deviation. Throat without lesion,  uvula is in the midline.  NECK:  No thyromegaly. No carotid bruits. No supraclavicular adenopathy.  CHEST:  Clear to auscultation and percussion. Did not hear any wheezes.  HEART:  Regular sinus rhythm, no murmurs.  ABDOMEN:  Soft with no hepatosplenomegaly.  EXTREMITIES:  Pulses are 2+, there is no clubbing or edema.  NEUROLOGIC:  She is oriented x3. Cranial nerves are intact. Sensory and  motor intact.  SKIN:  Without lesion.   IMPRESSION:  Bilateral pulmonary infiltrate. Rule out cancer. Rule out  inflammatory lung disease. Rule out atypical pneumonia.   PLAN:  __________ Lung biopsy x2.           ______________________________  Ines Bloomer, M.D.     DPB/MEDQ  D:  04/19/2005  T:  04/19/2005  Job:  [id number]

## 2010-06-02 NOTE — Op Note (Signed)
NAME:  [name], [name]                      ACCOUNT NO.:  [id number]   MEDICAL RECORD NO.:  [id number]                   PATIENT TYPE:  INP   LOCATION:  9125                                 FACILITY:  [facility]   PHYSICIAN:  Randye Lobo, M.D.                DATE OF BIRTH:  12-Jun-1975   DATE OF PROCEDURE:  07/28/2002  DATE OF DISCHARGE:                                 OPERATIVE REPORT   PREOPERATIVE DIAGNOSES:  1. Intrauterine gestation at 37 weeks.  2. Gestational diabetes mellitus, diet controlled.  3. Polyhydramnios.  4. Nonreassuring fetal assessment.   POSTOPERATIVE DIAGNOSES:  1. Intrauterine gestation at 37 weeks.  2. Gestational diabetes mellitus, diet controlled.  3. Polyhydramnios.  4. Nonreassuring fetal assessment.   PROCEDURE:  Vacuum-assisted vaginal delivery with repair of second-degree  laceration of the perineum.   SURGEON:  Randye Lobo, M.D.   ANESTHESIA:  Local, 1% lidocaine.   ESTIMATED BLOOD LOSS:  400 mL.   URINE OUTPUT:  50 mL.   COMPLICATIONS:  None.   INDICATIONS FOR PROCEDURE:  The patient is a 35 year old gravida 2, para 0-1-  0-1, Caucasian female who presented at 74 weeks' gestation to the [facility] reporting spontaneous rupture of membranes with clear  fluid at 2:30.  When the patient was examined in maternity admission she was  noted to be 7-8 cm with an urge to push and she was taken immediately down  to labor and delivery.  Initially the patient began pushing.  When the  examiner arrived, the patient was found to have an anterior lip and the  patient was told to stop pushing.  She did receive Stadol 2 mg intravenously  at this time.  A fetal scalp electrode was placed to improve continuous  monitoring.  After the fetal scalp electrode was placed, the fetal heart  rate tracing was reassuring with accelerations.   The patient's antepartum course was significant for gestational diabetes  mellitus which was diet  controlled.  The patient had an ultrasound performed  approximately two weeks prior documenting polyhydramnios and growth which  was appropriate for gestational age.  Of note, a prior ultrasound had  suggested a two-vessel umbilical cord.   The patient went on to achieve complete dilation of the cervix.  With some  pushing efforts, there was some urine which was emptied from the bladder  spontaneously.  A sterile I&O catheterization was performed to drain the  remaining 50 mL from within the bladder.  As the patient developed pushing,  severe decelerations of the fetal heart rate tracing were present.  The  recommendation was made by the obstetrician to proceed with a vacuum-  assisted vaginal delivery.  The patient agreed to proceed.   FINDINGS:  A viable female was delivered at 4:44 a.m. over a second-degree  perineal laceration.  Apgars were 9 at one minute and 9 at five minutes.  The  infant was vigorous at birth.  The cord pH was noted to be 7.23.   The placenta was intact and had a marginal insertion of a two-vessel cord.   PROCEDURE:  The patient had an IV in place.  The patient was examined and  the vertex was noted to be in the ROA position at the 2+ station.  The  Mityvac was placed to the vertex.  It was used over two contractions with no  pop-offs.  The newborn did deliver on the second contraction.  There was no  shoulder dystocia.  After the infant was delivered the cord was doubly  clamped and cut and the newborn was taken immediately over to the  pediatricians who were in attendance.  The cord pH was obtained and was  noted to be 7.23.   The placenta was delivered spontaneously.  The cervix and vagina were  examined and demonstrated no lacerations.  There was a second-degree  perineal laceration which was repaired with 1% lidocaine and 2-0 Vicryl  Rapide suture.  The patient did receive Pitocin 20 units intravenously.  There were no complications of the procedure.  All  sponge, instrument, and  needle counts were correct.                                               Randye Lobo, M.D.    BES/MEDQ  D:  07/28/2002  T:  07/28/2002  Job:  [id number]

## 2010-06-02 NOTE — Op Note (Signed)
NAMEKATERIN, NEGRETE NO.:  [id number]   MEDICAL RECORD NO.:  [id number]          PATIENT TYPE:  INP   LOCATION:  2550                         FACILITY:  [facility]   PHYSICIAN:  Ines Bloomer, M.D. DATE OF BIRTH:  02/17/1975   DATE OF PROCEDURE:  DATE OF DISCHARGE:                                 OPERATIVE REPORT   PREOPERATIVE DIAGNOSIS:  Right upper lobe, left lower lobe and left upper  lobe infiltrate.   POSTOPERATIVE DIAGNOSIS:  Right upper lobe, left lower lobe and left upper  lobe infiltrate.   OPERATION PERFORMED:  Left video assisted thoracoscopic surgery, lung biopsy  x3/   SURGEON:  Ines Bloomer, M.D.   ASSISTANT:  Pecola Leisure, PA   ANESTHESIA:  General.   DESCRIPTION OF PROCEDURE:  After percutaneous insertion of all monitoring  lines, the patient underwent general anesthesia.  She was turned to the left  lateral thoracotomy position and a dual lumen tube was inserted.  The left  lung was deflated.  She was prepped and draped in the usual sterile manner.  Two trocar sites were made, one in the midaxillary line, the eighth  intercostal space and one in the anterior axillary line at the sixth  intercostal space.  Two trocars were inserted.  A 2 degree scope was  inserted and pictures were taken of a process that involved the left  superior segment and went down to the left superior segment of the left  lower lobe and kind of the posterior segment of the left upper lobe where  there was a dense consolidate.  A third trocar site was made at the fifth  intercostal space at the midaxillary line, about 2 to 3 cm, enough to get a  finger in to palpate this dense adhesion.  Then using 1S and EZ-45 stapler,  we first took a small biopsy of the left superior segment.  We could not get  a big one because it was so consolidated.  We saved part of this for culture  and the rest for frozen section.  Then we looked at the lingula which had  some  lingular sparing but at the base of the lingula there was some  involvement, so we did a biopsy across the base of the lingula resecting  about 2 to 3 cm of lingula and it was obviously involved.  Then finally, we  got the medial basilar segment which was involved and resected a 2 to 3 cm  piece of the medial basilar segment with the EZ-45 stapler in multiple  applications.  All bleeding was electrocoagulated.  Two chest tubes were  placed through the anterior and posterior trocar sites, tied in place.  The  superior trocar site was closed with interrupted #1 Vicryl, 2-0 Vicryl in  the subcutaneous tissue and Dermabond for the skin.  An On-Q catheter was  placed in the usual  fashion subpleurally, Marcaine block was done in the usual fashion.  The  patient was returned to recovery room in stable condition.  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  ______________________________  Ines Bloomer, M.D.     DPB/MEDQ  D:  04/24/2005  T:  04/24/2005  Job:  [id number]   cc:   Danice Goltz, M.D. [facility]  [address] [place], [place] [address]

## 2010-06-22 NOTE — Consult Note (Signed)
  NAMEJULENA, BARBOUR NO.:  [id number]  MEDICAL RECORD NO.:  [id number]           PATIENT TYPE:  I  LOCATION:  4505                         FACILITY:  [facility]  PHYSICIAN:  Kristine Garbe. Ezzard Standing, M.D.DATE OF BIRTH:  1975/09/28  DATE OF CONSULTATION:  05/11/2010 DATE OF DISCHARGE:                                CONSULTATION   REASON FOR CONSULTATION:  Evaluate the patient with right neck infection, questionable abscess.  BRIEF HISTORY:  [name] is a 35 year old female with history of sarcoid who presented to the hospital with about a 3-day history of right facial and right neck swelling.  She previously had a steroid injection in the right TMJ joint about a week ago.  She initially saw her local physician and was started on Cleocin on Tuesday.  She had a CT scan, which showed a large right submandibular gland.  Her symptoms progressed with increasing swelling, low-grade fever, and the patient was subsequently admitted to the hospital the following day on Wednesday and started on IV antibiotics.  The patient has been having difficulty chewing and swallowing.  She gives no previous history of submandibular gland infections or neck infection.  She denies any dental infections. She had a CT scan of her neck today, which shows swelling in the right submandibular area with a large right submandibular gland and submental swelling, but no drainable abscess noted on the CT scan.  On bedside examination, the patient has diffuse swelling of the right submandibular area that extends up into the submental region.  Mouth is actually reasonably soft, has a little bit of swelling, could not elicit any purulent drainage from the submandibular duct, and could not obtain culture from the duct region.  IMPRESSION:  Right submandibular sialadenitis with surrounding cellulitis.  Blood cultures are pending.  Recommend broad-spectrum IV antibiotics, good hydration with p.o.  fluids, most common bacteria and sialadenitis with the staph.  We will follow up the patient during hospitalization.          ______________________________ Kristine Garbe Ezzard Standing, M.D.     CEN/MEDQ  D:  05/11/2010  T:  05/12/2010  Job:  [id number]  Electronically Signed by Dillard Cannon M.D. on 06/22/2010 11:30:50 AM

## 2010-07-11 ENCOUNTER — Emergency Department (HOSPITAL_COMMUNITY): Payer: 59

## 2010-07-11 ENCOUNTER — Emergency Department (HOSPITAL_COMMUNITY)
Admission: EM | Admit: 2010-07-11 | Discharge: 2010-07-11 | Disposition: A | Discharge status: Home / Self Care | Payer: 59 | Attending: Emergency Medicine | Admitting: Emergency Medicine

## 2010-07-11 DIAGNOSIS — R42 Dizziness and giddiness: Secondary | ICD-10-CM | POA: Insufficient documentation

## 2010-07-11 DIAGNOSIS — M542 Cervicalgia: Secondary | ICD-10-CM | POA: Insufficient documentation

## 2010-07-11 DIAGNOSIS — D869 Sarcoidosis, unspecified: Secondary | ICD-10-CM | POA: Insufficient documentation

## 2010-07-11 DIAGNOSIS — R220 Localized swelling, mass and lump, head: Secondary | ICD-10-CM | POA: Insufficient documentation

## 2010-07-11 DIAGNOSIS — K1120 Sialoadenitis, unspecified: Secondary | ICD-10-CM | POA: Insufficient documentation

## 2010-07-11 DIAGNOSIS — R51 Headache: Secondary | ICD-10-CM | POA: Insufficient documentation

## 2010-07-11 DIAGNOSIS — J320 Chronic maxillary sinusitis: Secondary | ICD-10-CM | POA: Insufficient documentation

## 2010-07-11 LAB — COMPREHENSIVE METABOLIC PANEL
AST: 15 U/L (ref 0–37)
Albumin: 3.4 g/dL — ABNORMAL LOW (ref 3.5–5.2)
Alkaline Phosphatase: 71 U/L (ref 39–117)
CO2: 25 mEq/L (ref 19–32)
Chloride: 103 mEq/L (ref 96–112)
GFR calc non Af Amer: >60 mL/min (ref >60)
Potassium: 3.9 mEq/L (ref 3.5–5.1)
Total Bilirubin: 0.4 mg/dL (ref 0.3–1.2)

## 2010-07-11 LAB — CBC
HCT: 41.3 % (ref 36.0–46.0)
Platelets: 307 10*3/uL (ref 150–400)
RBC: 4.79 MIL/uL (ref 3.87–5.11)
RDW: 13.8 % (ref 11.5–15.5)
WBC: 11.9 10*3/uL — ABNORMAL HIGH (ref 4.0–10.5)

## 2010-07-11 LAB — DIFFERENTIAL
Basophils Absolute: 0.1 10*3/uL (ref 0.0–0.1)
Eosinophils Absolute: 0.1 10*3/uL (ref 0.0–0.7)
Eosinophils Relative: 1 % (ref 0–5)
Lymphocytes Relative: 14 % (ref 12–46)
Lymphs Abs: 1.7 10*3/uL (ref 0.7–4.0)
Neutrophils Relative %: 78 % — ABNORMAL HIGH (ref 43–77)

## 2010-07-11 LAB — URINALYSIS, ROUTINE W REFLEX MICROSCOPIC
Nitrite: NEGATIVE
Specific Gravity, Urine: 1.022 (ref 1.005–1.030)
Urobilinogen, UA: 0.2 mg/dL (ref 0.0–1.0)
pH: 7 (ref 5.0–8.0)

## 2010-07-11 MED ORDER — IOHEXOL 300 MG/ML  SOLN
100.0000 mL | Freq: Once | INTRAMUSCULAR | Status: AC | PRN
  Administered 2010-07-11: 100 mL via INTRAVENOUS

## 2010-07-12 LAB — URINE CULTURE: Culture: NO GROWTH

## 2010-10-23 LAB — CBC
Hemoglobin: 14.1
RBC: 4.8
WBC: 9.3

## 2010-10-26 ENCOUNTER — Other Ambulatory Visit: Payer: Self-pay | Admitting: Otolaryngology

## 2010-11-16 ENCOUNTER — Other Ambulatory Visit: Payer: Self-pay | Admitting: Emergency Medicine

## 2010-11-16 DIAGNOSIS — R413 Other amnesia: Secondary | ICD-10-CM

## 2010-11-19 ENCOUNTER — Ambulatory Visit
Admission: RE | Admit: 2010-11-19 | Discharge: 2010-11-19 | Disposition: A | Discharge status: Home / Self Care | Payer: 59 | Source: Ambulatory Visit | Attending: Emergency Medicine | Admitting: Emergency Medicine

## 2010-11-19 DIAGNOSIS — R413 Other amnesia: Secondary | ICD-10-CM

## 2010-11-19 MED ORDER — GADOBENATE DIMEGLUMINE 529 MG/ML IV SOLN
15.0000 mL | Freq: Once | INTRAVENOUS | Status: AC | PRN
  Administered 2010-11-19: 15 mL via INTRAVENOUS

## 2011-01-26 ENCOUNTER — Ambulatory Visit (INDEPENDENT_AMBULATORY_CARE_PROVIDER_SITE_OTHER): Payer: 59

## 2011-01-26 DIAGNOSIS — J99 Respiratory disorders in diseases classified elsewhere: Secondary | ICD-10-CM

## 2011-01-26 DIAGNOSIS — D234 Other benign neoplasm of skin of scalp and neck: Secondary | ICD-10-CM

## 2011-01-26 DIAGNOSIS — L98499 Non-pressure chronic ulcer of skin of other sites with unspecified severity: Secondary | ICD-10-CM

## 2011-01-26 DIAGNOSIS — R05 Cough: Secondary | ICD-10-CM

## 2011-01-29 ENCOUNTER — Ambulatory Visit (INDEPENDENT_AMBULATORY_CARE_PROVIDER_SITE_OTHER): Payer: 59

## 2011-01-29 DIAGNOSIS — R05 Cough: Secondary | ICD-10-CM

## 2011-01-29 DIAGNOSIS — J9801 Acute bronchospasm: Secondary | ICD-10-CM

## 2011-01-29 DIAGNOSIS — J99 Respiratory disorders in diseases classified elsewhere: Secondary | ICD-10-CM

## 2011-01-29 DIAGNOSIS — R059 Cough, unspecified: Secondary | ICD-10-CM

## 2011-02-19 ENCOUNTER — Telehealth: Payer: Self-pay

## 2011-02-19 NOTE — Telephone Encounter (Signed)
Pt reports she has been to pulmonologist and ENT in [facility] since seeing Dr. Cleta Alberts. They said her lungs were clear and at time of ENT visit sinuses looked fine. Since then though her cough has gotten worse again (non-productive) and has L sinus pressure and green mucus when blows nose, and her voice is hoarse. She is taking 20 mg Pred. BID and has been able to tolerate by taking 1/2 tab 0.5 Xanax with each Pred dose and then she takes 1 tab xanax Qhs. She will run out of xanax this Friday, if you could RF for her. Also, she didn't know if you thought she should be on an Abx. She has no fever and she hates to take Abxs, but doesn't know why she is getting worse. Pt does not want to come see any other provider, but will come to [address] Tues. If Dr. Cleta Alberts can fit her in. Please advise.  Pt also wanted Dr Cleta Alberts to know that ENT thinks R ear pain is d/t cranial nerve, and also that she has rheumatology appt on March 5.

## 2011-02-19 NOTE — Telephone Encounter (Signed)
.  [facility]   Pt is not feeling well, wants dr Cleta Alberts to call her,I explained that dr Cleta Alberts is not here,but pt states he knows her and will call her anyway???  Best phone # [phone]

## 2011-02-20 ENCOUNTER — Ambulatory Visit (INDEPENDENT_AMBULATORY_CARE_PROVIDER_SITE_OTHER): Payer: 59 | Admitting: Emergency Medicine

## 2011-02-20 VITALS — BP 126/84 | HR 104 | Temp 97.0°F | Resp 18 | Ht 64.0 in | Wt 152.2 lb

## 2011-02-20 DIAGNOSIS — J0100 Acute maxillary sinusitis, unspecified: Secondary | ICD-10-CM

## 2011-02-20 DIAGNOSIS — H5450 Low vision, one eye, unspecified eye: Secondary | ICD-10-CM

## 2011-02-20 DIAGNOSIS — J99 Respiratory disorders in diseases classified elsewhere: Secondary | ICD-10-CM

## 2011-02-20 DIAGNOSIS — J329 Chronic sinusitis, unspecified: Secondary | ICD-10-CM

## 2011-02-20 NOTE — Patient Instructions (Signed)
Plan is to start the patient on Augmentin 875 twice a day #20 she is given Diflucan 150 one by mouth now and repeat in one week for secondary yeast infection. She is also to be on Xanax 0.5 to take one half in the morning one half in the afternoon and one at bedtime. She was also given a prescription for Norco 325/5 to take one every 6 hours when necessary pain or cough #30 with no refills the Xanax prescription was given and she was given #60 with no refills

## 2011-02-20 NOTE — Progress Notes (Signed)
  Subjective:    Patient ID: [name], female    DOB: 06-05-1975, 36 y.o.   MRN: [id number]  HPI the patient presents with severe left maxillary sinus discomfort she has had recent visits to the pulmonary department any into department at [facility]. She is currently on prednisone Plaquenil and methotrexate for treatment of her sarcoid. She has a persistent pain in the left side of her nose     Review of Systems She has had a cough but denies any production with the cough she has not been using her inhaler Schrag    Objective:   Physical Exam HEENT exam reveals tenderness over the left maxillary sinus respiratory exam reveals bilateral prolongation of expiration with expiratory wheezes bilaterally.        Assessment & Plan:  Recent flare of her sarcoidosis with evidence by exam of left maxillary sinusitis. Plan antibiotics as well as hydrocodone to help with cough suppression. She was encouraged to use her Qvar 2 puffs twice a day and her pro air HFA as previously instructed.

## 2011-02-20 NOTE — Telephone Encounter (Signed)
Patient presented to the office and will be seen as a work in at 104.

## 2011-02-22 ENCOUNTER — Telehealth: Payer: Self-pay

## 2011-02-22 NOTE — Telephone Encounter (Signed)
.  [facility] PT REQUESTING A CALL BACK REGARDING HER VISIT PLEASE CALL [phone]

## 2011-02-23 ENCOUNTER — Ambulatory Visit: Payer: 59

## 2011-02-23 ENCOUNTER — Ambulatory Visit (INDEPENDENT_AMBULATORY_CARE_PROVIDER_SITE_OTHER): Payer: 59 | Admitting: Emergency Medicine

## 2011-02-23 VITALS — BP 132/81 | HR 93 | Temp 98.5°F | Resp 18 | Ht 64.0 in | Wt 153.2 lb

## 2011-02-23 DIAGNOSIS — J019 Acute sinusitis, unspecified: Secondary | ICD-10-CM

## 2011-02-23 DIAGNOSIS — R0602 Shortness of breath: Secondary | ICD-10-CM

## 2011-02-23 DIAGNOSIS — J99 Respiratory disorders in diseases classified elsewhere: Secondary | ICD-10-CM

## 2011-02-23 DIAGNOSIS — D860 Sarcoidosis of lung: Secondary | ICD-10-CM

## 2011-02-23 DIAGNOSIS — F17200 Nicotine dependence, unspecified, uncomplicated: Secondary | ICD-10-CM

## 2011-02-23 DIAGNOSIS — J329 Chronic sinusitis, unspecified: Secondary | ICD-10-CM

## 2011-02-23 LAB — GLUCOSE, POCT (MANUAL RESULT ENTRY): POC Glucose: 84

## 2011-02-23 NOTE — Patient Instructions (Signed)
Phone call made to [facility] and case discussed with the pulmonary specialist on call for Dr. Britta Mccreedy. Plan is to increase her prednisone to 60 mg daily. The physicians that he contact Dr. Christene Slates on Monday to see about getting Lianah worked in next week for evaluation. Have encouraged her to continue to try and stop her smoking at. I also feel she would benefit from a week off work next week so she will get her illness under control.

## 2011-02-23 NOTE — Telephone Encounter (Signed)
Spoke with pt and she is not feeling any better. She is now SOB and still very congested. Advised pt to come in for eval. Pt understood. She will come in today to see Dr. Cleta Alberts

## 2011-02-23 NOTE — Progress Notes (Signed)
  Subjective:    Patient ID: [name], female    DOB: 01/28/1975, 36 y.o.   MRN: [id number]  HPI patient seen Tuesday with flare of her sarcoidosis. She was already on prednisone therapy but developed a cough and congestion. She continues to be very tight in her chest with marked dyspnea on exertion. She's not any definite fever and has not had any sputum production with her cough.    Review of Systems she has extreme tachycardia with her coughing spells and lightheaded at times and very short of breath with any exertion.     Objective:   Physical Exam on examination the patient appears in no acute distress. Her respiratory exam reveals tight inspiratory wheezes present throughout both lung fields.   [facility] reading (PRIMARY) by  Dr. Cleta Alberts  Chest x-ray reveals prominent interstitial markings. in both lower lung fields. The hilar area seems prominent on the lateral.     Assessment & Plan:  I suspect this is an acute flare of her sarcoidosis. Her lungs are very tight to examination but mainly on deep inspiration. Her peak flow was 300. Her pulse ox was 99.

## 2011-04-09 ENCOUNTER — Other Ambulatory Visit: Payer: Self-pay | Admitting: Family Medicine

## 2011-04-09 MED ORDER — ALPRAZOLAM 0.5 MG PO TABS: ORAL_TABLET | ORAL | Status: DC

## 2011-04-09 MED ORDER — ALPRAZOLAM 0.5 MG PO TABS: 0.5000 mg | ORAL_TABLET | Freq: Every evening | ORAL | Status: DC | PRN

## 2011-04-30 ENCOUNTER — Telehealth: Payer: Self-pay

## 2011-04-30 NOTE — Telephone Encounter (Signed)
PT STATES THAT HER FINGER IS INFECTED. ADVISED PT TO COME IN TO BE SEEN- SHE SAID SHE DOES NOT WANT TO WAIT AND WOULD LIKE TO KNOW IF DR. DAUB WILL JUST WRITE HER AN ANTIBIOTIC.

## 2011-05-02 NOTE — Telephone Encounter (Signed)
Needs to return to clinic.  I will be here tomorrow at 9am and will be happy to see her

## 2011-05-03 NOTE — Telephone Encounter (Signed)
LMOM THAT PT NEEDS TO RTC PER DR DAUB.

## 2011-06-12 ENCOUNTER — Telehealth: Payer: Self-pay

## 2011-06-12 NOTE — Telephone Encounter (Signed)
Pt is requesting labs cbc and ebstein-barr as back back as we can go she is needing to take to a dr appt with her. She would like for medical records to contact her when medical records are ready for pick-up [phone]

## 2011-06-13 NOTE — Telephone Encounter (Signed)
LMOM for patient records are ready for pick-up all labs up to 02/23/07

## 2011-07-05 ENCOUNTER — Ambulatory Visit (INDEPENDENT_AMBULATORY_CARE_PROVIDER_SITE_OTHER): Payer: 59 | Admitting: Emergency Medicine

## 2011-07-05 VITALS — BP 109/71 | HR 90 | Temp 98.1°F | Resp 16 | Ht 64.0 in | Wt 162.0 lb

## 2011-07-05 DIAGNOSIS — Z0000 Encounter for general adult medical examination without abnormal findings: Secondary | ICD-10-CM

## 2011-07-05 DIAGNOSIS — M791 Myalgia, unspecified site: Secondary | ICD-10-CM

## 2011-07-05 DIAGNOSIS — J99 Respiratory disorders in diseases classified elsewhere: Secondary | ICD-10-CM

## 2011-07-05 DIAGNOSIS — IMO0001 Reserved for inherently not codable concepts without codable children: Secondary | ICD-10-CM

## 2011-07-05 DIAGNOSIS — R635 Abnormal weight gain: Secondary | ICD-10-CM

## 2011-07-05 DIAGNOSIS — M2540 Effusion, unspecified joint: Secondary | ICD-10-CM

## 2011-07-05 LAB — POCT CBC
Lymph, poc: 2.8 (ref 0.6–3.4)
MCH, POC: 29.1 pg (ref 27–31.2)
MCHC: 32.3 g/dL (ref 31.8–35.4)
MCV: 90.2 fL (ref 80–97)
MID (cbc): 0.5 (ref 0–0.9)
POC LYMPH PERCENT: 29.6 %L (ref 10–50)
Platelet Count, POC: 371 10*3/uL (ref 142–424)
RBC: 4.67 M/uL (ref 4.04–5.48)
RDW, POC: 12.8 %
WBC: 9.3 10*3/uL (ref 4.6–10.2)

## 2011-07-05 LAB — COMPREHENSIVE METABOLIC PANEL
Albumin: 3.8 g/dL (ref 3.5–5.2)
Alkaline Phosphatase: 55 U/L (ref 39–117)
BUN: 11 mg/dL (ref 6–23)
Glucose, Bld: 85 mg/dL (ref 70–99)
Potassium: 3.8 mEq/L (ref 3.5–5.3)

## 2011-07-05 LAB — POCT URINALYSIS DIPSTICK
Glucose, UA: NEGATIVE
Ketones, UA: NEGATIVE
Spec Grav, UA: 1.02

## 2011-07-05 NOTE — Progress Notes (Signed)
  Subjective:    Patient ID: [name], female    DOB: July 09, 1975, 36 y.o.   MRN: [id number]  HPI patient enters for recheck of her sarcoidosis . she saw a rheumatologist at [facility]. She is currently on prednisone 5 mg a day. She's off her methotrexate since it did not seem to help. She complains of severe swelling myalgias involving all of her muscles and joints. She is also off her Plaquenil. He states he is not depressed just hurts all the time. Despite all the trouble she has been through she has not missed any work   Review of Systems     Objective:   Physical Exam patient does not look acutely ill. Her neck is supple chest clear heart regular rate no murmurs. Examination of the joints reveals what appears to be red dots involving the hands. She has a serpiginous rash involving the forearms bilaterally. She appears puffy but not edematous.  Results for orders placed in visit on 07/05/11  POCT CBC      Component Value Range   WBC 9.3  4.6 - 10.2 K/uL   Lymph, poc 2.8  0.6 - 3.4   POC LYMPH PERCENT 29.6  10 - 50 %L   MID (cbc) 0.5  0 - 0.9   POC MID % 5.8  0 - 12 %M   POC Granulocyte 6.0  2 - 6.9   Granulocyte percent 64.6  37 - 80 %G   RBC 4.67  4.04 - 5.48 M/uL   Hemoglobin 13.6  12.2 - 16.2 g/dL   HCT, POC 40.9  81.1 - 47.9 %   MCV 90.2  80 - 97 fL   MCH, POC 29.1  27 - 31.2 pg   MCHC 32.3  31.8 - 35.4 g/dL   RDW, POC 91.4     Platelet Count, POC 371  142 - 424 K/uL   MPV 7.9  0 - 99.8 fL        Assessment & Plan:  Patient with long-standing severe sarcoidosis. Currently unresponsive to methotrexate therapy. She is down to prednisone 5 mg a day. We'll plan on followup at [facility]. Routine labs were done today since they were not done yesterday

## 2011-07-06 LAB — ANA: Anti Nuclear Antibody(ANA): NEGATIVE

## 2011-07-06 LAB — ANCA SCREEN W REFLEX TITER: p-ANCA Screen: NEGATIVE

## 2011-07-06 LAB — ANGIOTENSIN CONVERTING ENZYME: Angiotensin-Converting Enzyme: 31 U/L (ref 8–52)

## 2011-07-18 ENCOUNTER — Telehealth: Payer: Self-pay

## 2011-07-18 NOTE — Telephone Encounter (Signed)
Physical Therapist Samson Frederic would like to speak with Dr. Cleta Alberts about this patient.  Phone # for [facility] [phone]

## 2011-07-19 NOTE — Telephone Encounter (Signed)
I talked about matters the patient's physical therapist and they will continue with the current therapy.

## 2011-08-01 ENCOUNTER — Ambulatory Visit: Payer: 59

## 2011-08-01 ENCOUNTER — Ambulatory Visit (INDEPENDENT_AMBULATORY_CARE_PROVIDER_SITE_OTHER): Payer: 59 | Admitting: Family Medicine

## 2011-08-01 VITALS — BP 130/76 | HR 72 | Temp 98.9°F | Resp 16 | Ht 64.0 in | Wt 158.4 lb

## 2011-08-01 DIAGNOSIS — J45909 Unspecified asthma, uncomplicated: Secondary | ICD-10-CM

## 2011-08-01 DIAGNOSIS — D869 Sarcoidosis, unspecified: Secondary | ICD-10-CM

## 2011-08-01 DIAGNOSIS — R600 Localized edema: Secondary | ICD-10-CM

## 2011-08-01 DIAGNOSIS — R609 Edema, unspecified: Secondary | ICD-10-CM

## 2011-08-01 NOTE — Progress Notes (Signed)
Subjective:    Patient ID: [name], female    DOB: 11-22-1975, 36 y.o.   MRN: [id number]  HPI  Pt presents to clinic for recheck of leg swelling.  She feels it has gotten worse today.  She has had it over the last month.  Thought it might be some of her medications so she stopped the methotrexate and plaquenil but the swelling has not improved.  She was put on HCTZ by pulmonologist for swelling and that seemed to help a little.  She stands up at work about half of her shift and it is worse at the end of the day and is better in the am but not resolved.  She has never had pitting with the swelling.  She feels over the last month her entire body has been swollen but today and most recently it has just been in her legs.  She has no new SOB but is still very fatigued.  Her legs hurts because they are tight but there is no calf pain.  She does not exercise.  She quit smoking 6 months ago.  She is very frustrated with the swelling and tightness in her legs.  Has plans to go to [facility] for sarcoidosis because her past treatment has not helped but she is still in referral process.    Review of Systems  Respiratory: Negative for cough and chest tightness.   Cardiovascular: Positive for palpitations (has PVCs at times) and leg swelling.       Objective:   Physical Exam  Constitutional: She is oriented to person, place, and time. She appears well-developed and well-nourished.  HENT:  Head: Normocephalic and atraumatic.  Right Ear: External ear normal.  Left Ear: External ear normal.  Nose: Nose normal.  Eyes: Conjunctivae are normal.  Neck: Normal range of motion.  Cardiovascular: Normal rate, regular rhythm, normal heart sounds and intact distal pulses.  Exam reveals no gallop and no friction rub.   No murmur heard. Pulmonary/Chest: Effort normal and breath sounds normal. She has no wheezes. She has no rales.       Pt has lower extremity swelling/puffiness about up to knee but no  pitting edema - her feet do not appear to be swollen to be her vessels and tendons are easy to see in her feet  Neurological: She is alert and oriented to person, place, and time.  Skin: Skin is warm and dry.  Psychiatric: She has a normal mood and affect. Her behavior is normal. Judgment and thought content normal.   Leg measurements      R side   L side 9cm below tibial tuberosity  43.75cm  42.5 cm 9cm above medial malleolus  31cm   32.5cm  20cm above tibial tuberosity  47cm   46cm  [facility] reading (PRIMARY) by  Dr. Alwyn Ren.  Prominent vascular and pulmonary markings consistent with sarcodiosis. EKG - NSR without T wave changes. - see scanned in media     Assessment & Plan:   1. Edema of leg  EKG 12-Lead, DG Chest 2 View, Comprehensive metabolic panel  2. Sarcoidosis  DG Chest 2 View  3. Asthma     Unsure what is causing her swelling and d/w pt pt.  ? Related partially with the hot weather.  Pt to continue HCTZ. Will wait for CMET to return.  Will plan on having patient to RTC in about 7-10d for recheck and leg measurement.  Pt seems frustrated and I question whether she is  worrying about things that she feels she might be able to control because of her frustration of not getting better with her sarcoidosis.  Discussed patient with Dr. Alwyn Ren.

## 2011-08-02 LAB — COMPREHENSIVE METABOLIC PANEL
Albumin: 4 g/dL (ref 3.5–5.2)
CO2: 26 mEq/L (ref 19–32)
Chloride: 102 mEq/L (ref 96–112)
Glucose, Bld: 78 mg/dL (ref 70–99)
Potassium: 3.5 mEq/L (ref 3.5–5.3)
Sodium: 139 mEq/L (ref 135–145)
Total Protein: 6.3 g/dL (ref 6.0–8.3)

## 2011-08-03 ENCOUNTER — Telehealth: Payer: Self-pay

## 2011-08-03 ENCOUNTER — Encounter: Payer: Self-pay | Admitting: *Deleted

## 2011-08-03 NOTE — Telephone Encounter (Signed)
Patient wants to pick up the latest lab results/tests and radiology report from 7/17//13

## 2011-08-03 NOTE — Progress Notes (Signed)
  Subjective:    Patient ID: [name], female    DOB: 08-27-1975, 36 y.o.   MRN: [id number]  HPI    Review of Systems     Objective:   Physical Exam        Assessment & Plan:  Discussed with Benny Lennert PA and reviewed films and agree on apporach.

## 2011-08-04 NOTE — Telephone Encounter (Signed)
Copies made and patient notified to pick up.

## 2011-09-17 ENCOUNTER — Other Ambulatory Visit: Payer: Self-pay

## 2011-09-17 MED ORDER — HYDROCHLOROTHIAZIDE 25 MG PO TABS: 25.0000 mg | ORAL_TABLET | Freq: Every day | ORAL | Status: DC

## 2011-11-12 ENCOUNTER — Telehealth: Payer: Self-pay

## 2011-11-12 NOTE — Telephone Encounter (Signed)
Can not treat UTI by phone, she needs to come in for this. Called her to advise, left message for her.

## 2011-11-12 NOTE — Telephone Encounter (Signed)
The patient called to request antibiotic and Diflucan for UTI symptoms.  Please call the patient at [phone].  The patient stated that the pharmacy on file is fine for rx.

## 2011-12-25 ENCOUNTER — Ambulatory Visit (INDEPENDENT_AMBULATORY_CARE_PROVIDER_SITE_OTHER): Payer: 59 | Admitting: Physician Assistant

## 2011-12-25 VITALS — BP 103/82 | HR 77 | Temp 98.5°F | Resp 18 | Wt 144.0 lb

## 2011-12-25 DIAGNOSIS — H66009 Acute suppurative otitis media without spontaneous rupture of ear drum, unspecified ear: Secondary | ICD-10-CM

## 2011-12-25 DIAGNOSIS — D869 Sarcoidosis, unspecified: Secondary | ICD-10-CM

## 2011-12-25 DIAGNOSIS — R509 Fever, unspecified: Secondary | ICD-10-CM

## 2011-12-25 DIAGNOSIS — E559 Vitamin D deficiency, unspecified: Secondary | ICD-10-CM

## 2011-12-25 DIAGNOSIS — R05 Cough: Secondary | ICD-10-CM

## 2011-12-25 DIAGNOSIS — I10 Essential (primary) hypertension: Secondary | ICD-10-CM

## 2011-12-25 DIAGNOSIS — F411 Generalized anxiety disorder: Secondary | ICD-10-CM

## 2011-12-25 LAB — POCT CBC
Lymph, poc: 2.7 (ref 0.6–3.4)
MCHC: 31.9 g/dL (ref 31.8–35.4)
MID (cbc): 0.5 (ref 0–0.9)
MPV: 8.3 fL (ref 0–99.8)
POC Granulocyte: 4.1 (ref 2–6.9)
POC LYMPH PERCENT: 36.7 %L (ref 10–50)
POC MID %: 7.5 %M (ref 0–12)
Platelet Count, POC: 420 10*3/uL (ref 142–424)
RDW, POC: 12.9 %

## 2011-12-25 LAB — BASIC METABOLIC PANEL
Calcium: 9.1 mg/dL (ref 8.4–10.5)
Sodium: 137 mEq/L (ref 135–145)

## 2011-12-25 MED ORDER — ALPRAZOLAM 0.5 MG PO TABS: ORAL_TABLET | ORAL | Status: AC

## 2011-12-25 MED ORDER — ALPRAZOLAM 0.5 MG PO TABS: ORAL_TABLET | ORAL | Status: DC

## 2011-12-25 MED ORDER — AMOXICILLIN 500 MG PO CAPS: ORAL_CAPSULE | ORAL | Status: DC

## 2011-12-25 MED ORDER — SERTRALINE HCL 100 MG PO TABS: ORAL_TABLET | ORAL | Status: DC

## 2011-12-25 MED ORDER — FUROSEMIDE 40 MG PO TABS: 40.0000 mg | ORAL_TABLET | Freq: Every day | ORAL | Status: AC

## 2011-12-25 NOTE — Progress Notes (Signed)
Subjective:    Patient ID: [name], female    DOB: Jun 11, 1975, 36 y.o.   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  Also poor energy and achy all over. No f/c.  Sarcoid has been stable.  She has never gone to [facility] like Dr. Cleta Alberts or her sarcoid specialist at [place].   She hasn't had a BMET or CMET done in a while.  She tells me in a unprompted manner that she "isn't depressed."  I questioned her further on this.  She has a lot of stress on her at work.  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  Her husband committed suicide this past summer.  They had not gotten along for a long time prior to that.  She is working on herself and being as healthy as possible. She is eating healthy.  She is not exercising regularly.  She is seeing a counselor to help her deal with all of these things.  Her mom committed suicide when she was 5 years old.  At this time the patient was put on zoloft.  She doesn't remember if it helped or not. She doesn't remember having any adverse side effects with it.  She does have friends, family, and other forms of support. She denies any homicidal or suicidal ideation.   Review of Systems  All other systems reviewed and are negative.       Objective:   Physical Exam  Nursing note and vitals reviewed. Constitutional: She is oriented to person, place, and time. She appears well-developed and well-nourished.  HENT:  Head: Normocephalic and atraumatic.  Left Ear: External ear normal.  Mouth/Throat: No oropharyngeal exudate (PND and erythema R>L side.).       R TM bulging and erythematous with distorted bony landmarks  Neck: Normal range of motion. Neck supple.  Cardiovascular: Normal rate, regular rhythm and normal heart sounds.   Pulmonary/Chest: Effort normal and breath sounds normal.  Lymphadenopathy:    Cervical adenopathy: shotty LN on R, none on L.  Neurological:  She is alert and oriented to person, place, and time.  Skin: Skin is warm and dry.  Psychiatric: She has a normal mood and affect. Her behavior is normal. Judgment and thought content normal.     Results for orders placed in visit on 12/25/11  POCT CBC      Component Value Range   WBC 7.3  4.6 - 10.2 K/uL   Lymph, poc 2.7  0.6 - 3.4   POC LYMPH PERCENT 36.7  10 - 50 %L   MID (cbc) 0.5  0 - 0.9   POC MID % 7.5  0 - 12 %M   POC Granulocyte 4.1  2 - 6.9   Granulocyte percent 55.8  37 - 80 %G   RBC 5.38  4.04 - 5.48 M/uL   Hemoglobin 15.2  12.2 - 16.2 g/dL   HCT, POC 40.9  81.1 - 47.9 %   MCV 88.5  80 - 97 fL   MCH, POC 28.3  27 - 31.2 pg   MCHC 31.9  31.8 - 35.4 g/dL   RDW, POC 91.4     Platelet Count, POC 420  142 - 424 K/uL   MPV 8.3  0 - 99.8 fL        Assessment & Plan:  R AOM Sarcoidosis-stable Anxiety  and depression with good support system-she is willing to try zoloft again since she took it previously without any problems.  She needs a RF on her xanax-she hasn't had a prescription since the summer for that.  She is to see me or Dr. Cleta Alberts in 4-6 weeks. Sooner if worse or any problems. Spent >45 mins face to face with patient.

## 2011-12-26 LAB — VITAMIN D 25 HYDROXY (VIT D DEFICIENCY, FRACTURES): Vit D, 25-Hydroxy: 11 ng/mL — ABNORMAL LOW (ref 30–89)

## 2011-12-28 MED ORDER — VITAMIN D (ERGOCALCIFEROL) 1.25 MG (50000 UNIT) PO CAPS: 50000.0000 [IU] | ORAL_CAPSULE | ORAL | Status: DC

## 2011-12-28 NOTE — Addendum Note (Signed)
Addended by: Anders Simmonds on: 12/28/2011 07:59 AM   Modules accepted: Orders

## 2012-06-02 ENCOUNTER — Other Ambulatory Visit: Payer: Self-pay

## 2012-06-02 MED ORDER — AMPHETAMINE-DEXTROAMPHET ER 20 MG PO CP24: 40.0000 mg | ORAL_CAPSULE | ORAL | Status: DC

## 2012-06-02 NOTE — Telephone Encounter (Signed)
Patient called requesting refill.  She would like to pick up Rx when ready.

## 2012-06-27 IMAGING — CT CT MAXILLOFACIAL W/ CM
3 series · 17 of 30 positions shown, 19 images · IV contrast (70ml omni 300)
Comparison: TMJ MRI 04/24/2010.

CLINICAL DATA: Right facial pain and swelling.  The patient
underwent right TMJ arthrocentesis 6 days prior.

CT MAXILLOFACIAL WITH CONTRAST
TECHNIQUE: Multidetector CT imaging of the maxillofacial
structures was performed with intravenous contrast. Multiplanar CT
image reconstructions were also generated.
Contrast: 70 ml Fmnipaque-KWW intravenously.

[Series 3: recon 2: supine facial bones · axial · 0.41mm/px · z∈[+47,+157]mm · 6 of 62 slices shown, 8 images]
[im 9/62  brain]
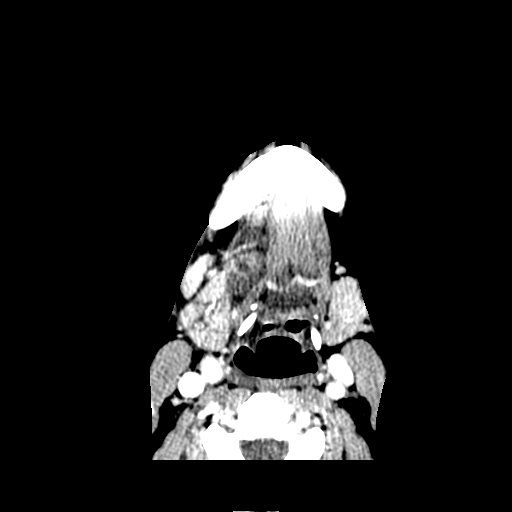
[im 9/62  bone]
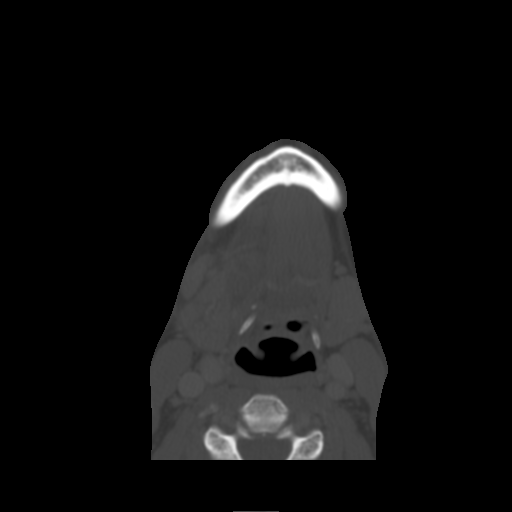
[im 18/62  bone]
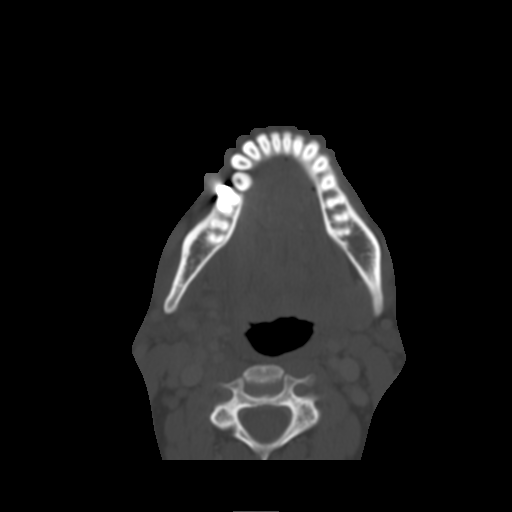
[im 27/62  bone]
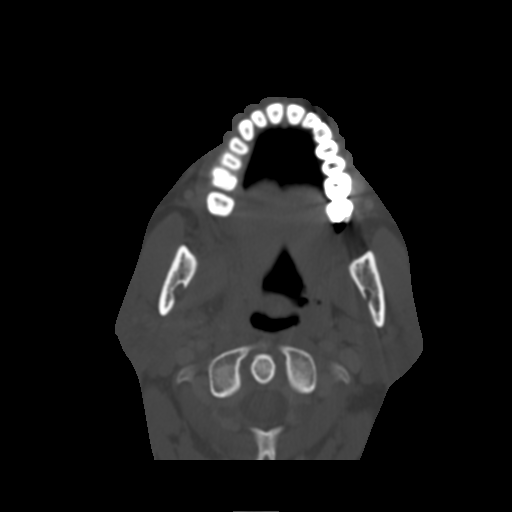
[im 35/62  bone]
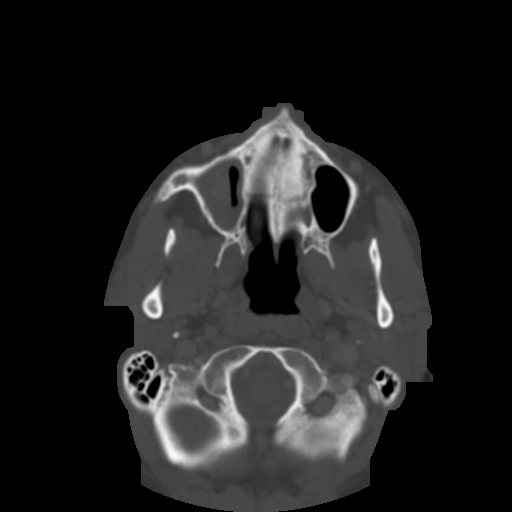
[im 44/62  brain]
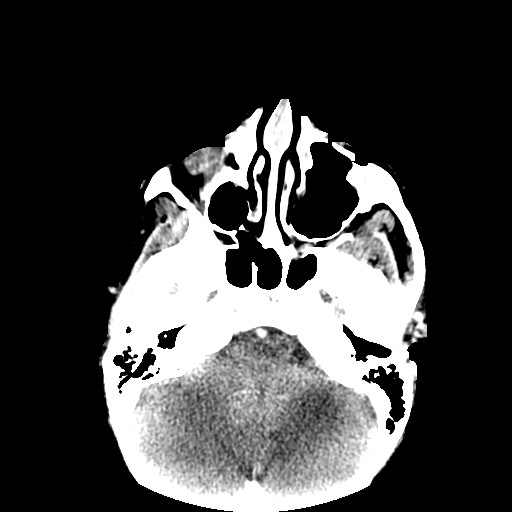
[im 44/62  bone]
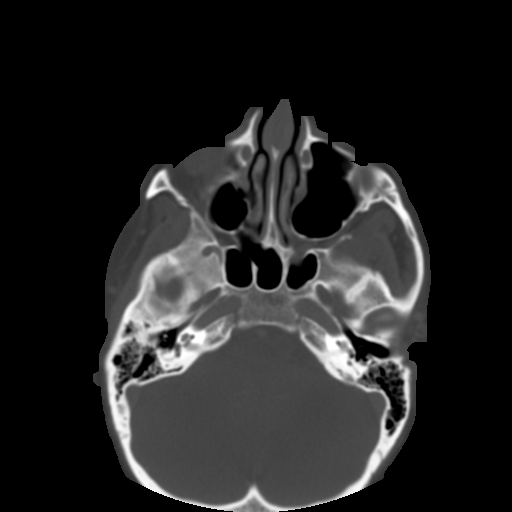
[im 53/62  bone]
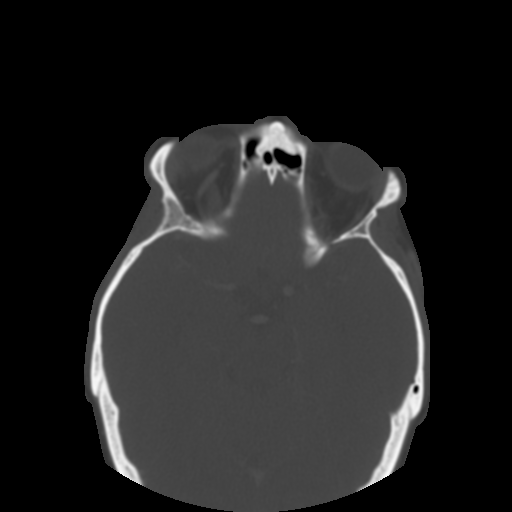

[Series 103: st cor · coronal · 0.41mm/px · 8 of 92 slices shown]
[im 9/92  bone]
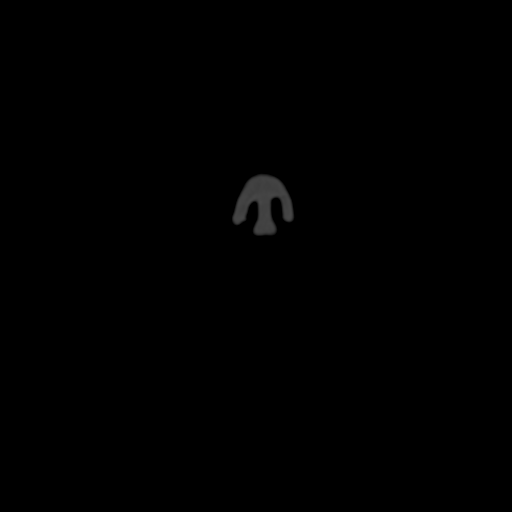
[im 17/92  bone]
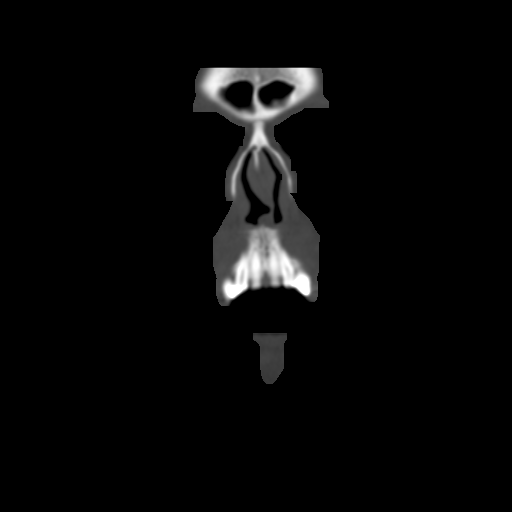
[im 34/92  bone]
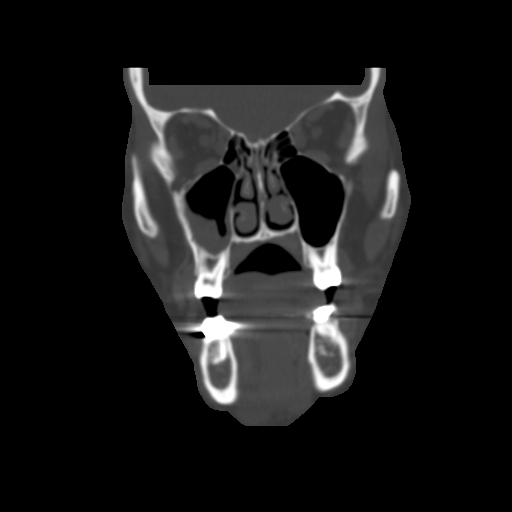
[im 42/92  bone]
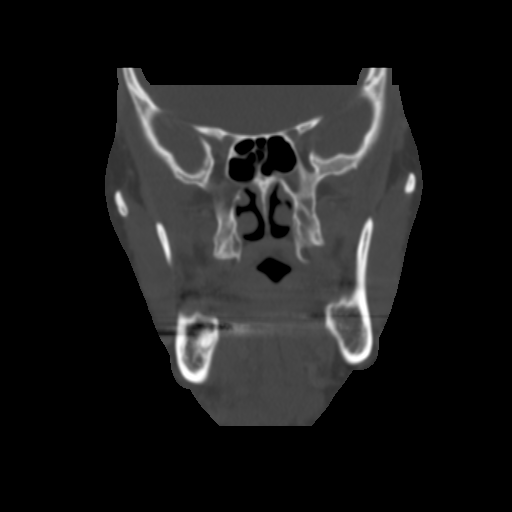
[im 50/92  bone]
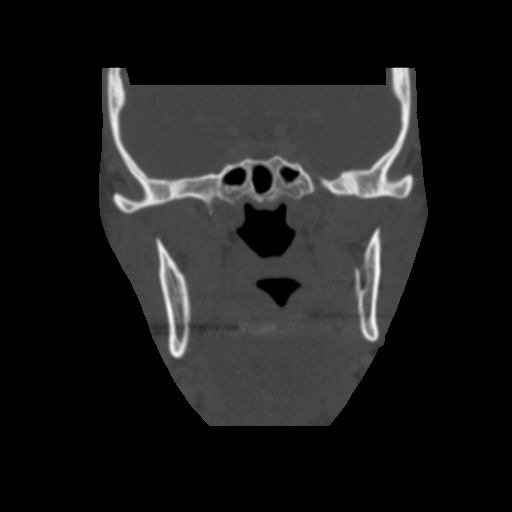
[im 58/92  bone]
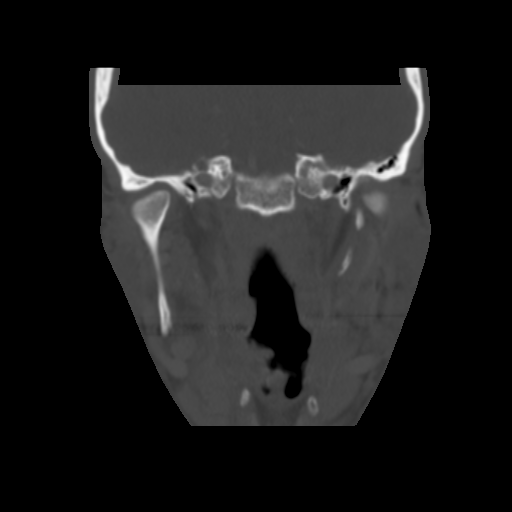
[im 75/92  bone]
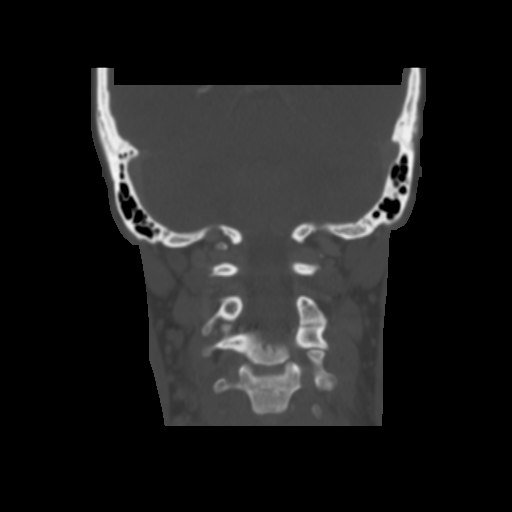
[im 83/92  bone]
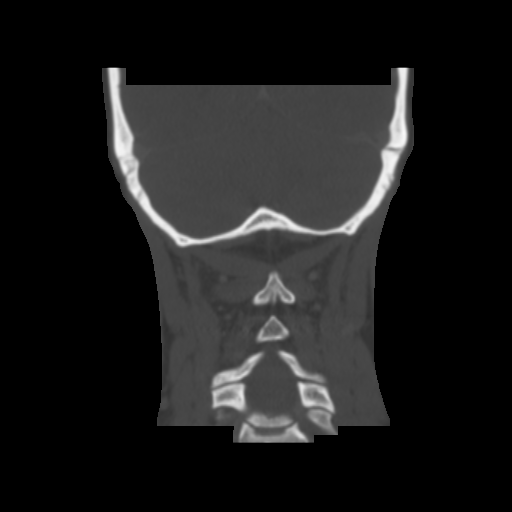

[Series 104: st sag · sagittal · 0.41mm/px · 3 of 82 slices shown]
[im 10/82  bone]
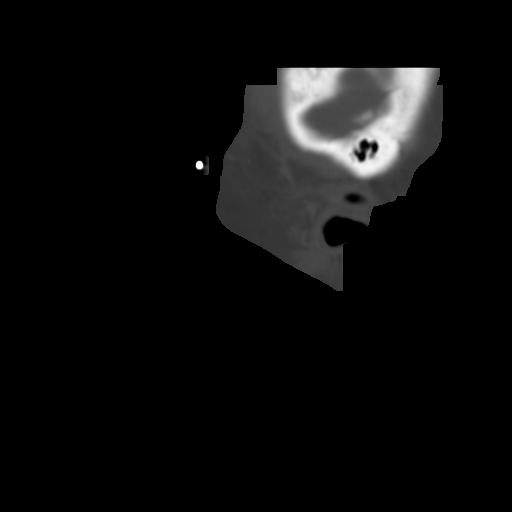
[im 19/82  bone]
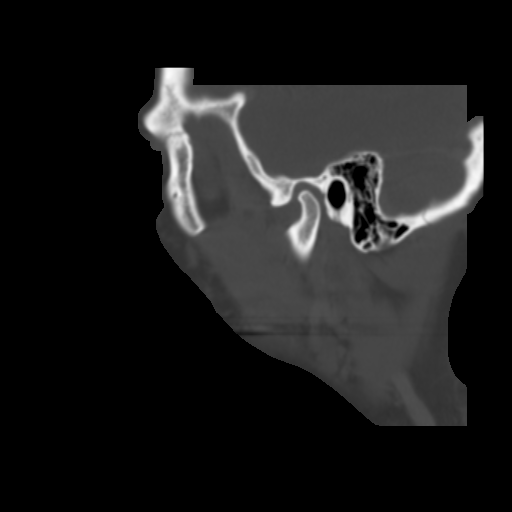
[im 28/82  bone]
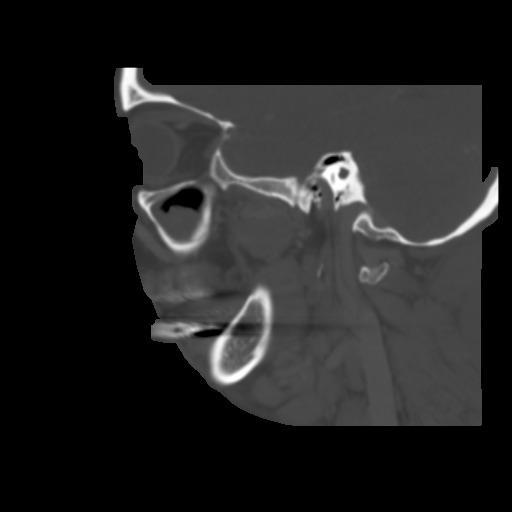

[17 of 30 positions shown; findings below may reference images not displayed]

FINDINGS: There is asymmetric enlargement of the right
submandibular gland associated with surrounding soft tissue edema.
There are several mildly enlarged submandibular and submental lymph
nodes on the right.  No focal fluid collection or sialolith is
demonstrated.  The parotid glands appear symmetric.

Inflammation does not appear to involve either temporal mandibular
joint. The mandibular condyles remain normal in appearance.  There
is no air in either temporal mandibular joint.  The mastoids and
middle ears are clear.

Right maxillary sinus mucosal thickening is similar to the prior
examination.  The additional paranasal sinuses are clear.  There is
no evidence of orbital inflammation.  No periodontal disease is
seen.  The visualized intracranial contents are unremarkable.
IMPRESSION: 1.  Right perimandibular cellulitis is associated with asymmetric
enlargement of the right submandibular gland and adjacent
adenopathy and likely represents sialoadenitis. No sialolith or
drainable fluid collection is demonstrated.
2.  The submandibular region was not fully imaged on the prior MRI,
but the current findings appear removed from the temporal
mandibular joint.
3.  No evidence of osteomyelitis or significant periodontal
disease.
4.  Stable right maxillary sinus mucosal thickening.

## 2012-06-29 IMAGING — CR DG CHEST 2V
2 series · 2 of 2 positions shown · non-contrast
Comparison: 05/02/2005

CLINICAL DATA: Hemoptysis.  Sarcoidosis.

CHEST - 2 VIEW

[w chest pa]
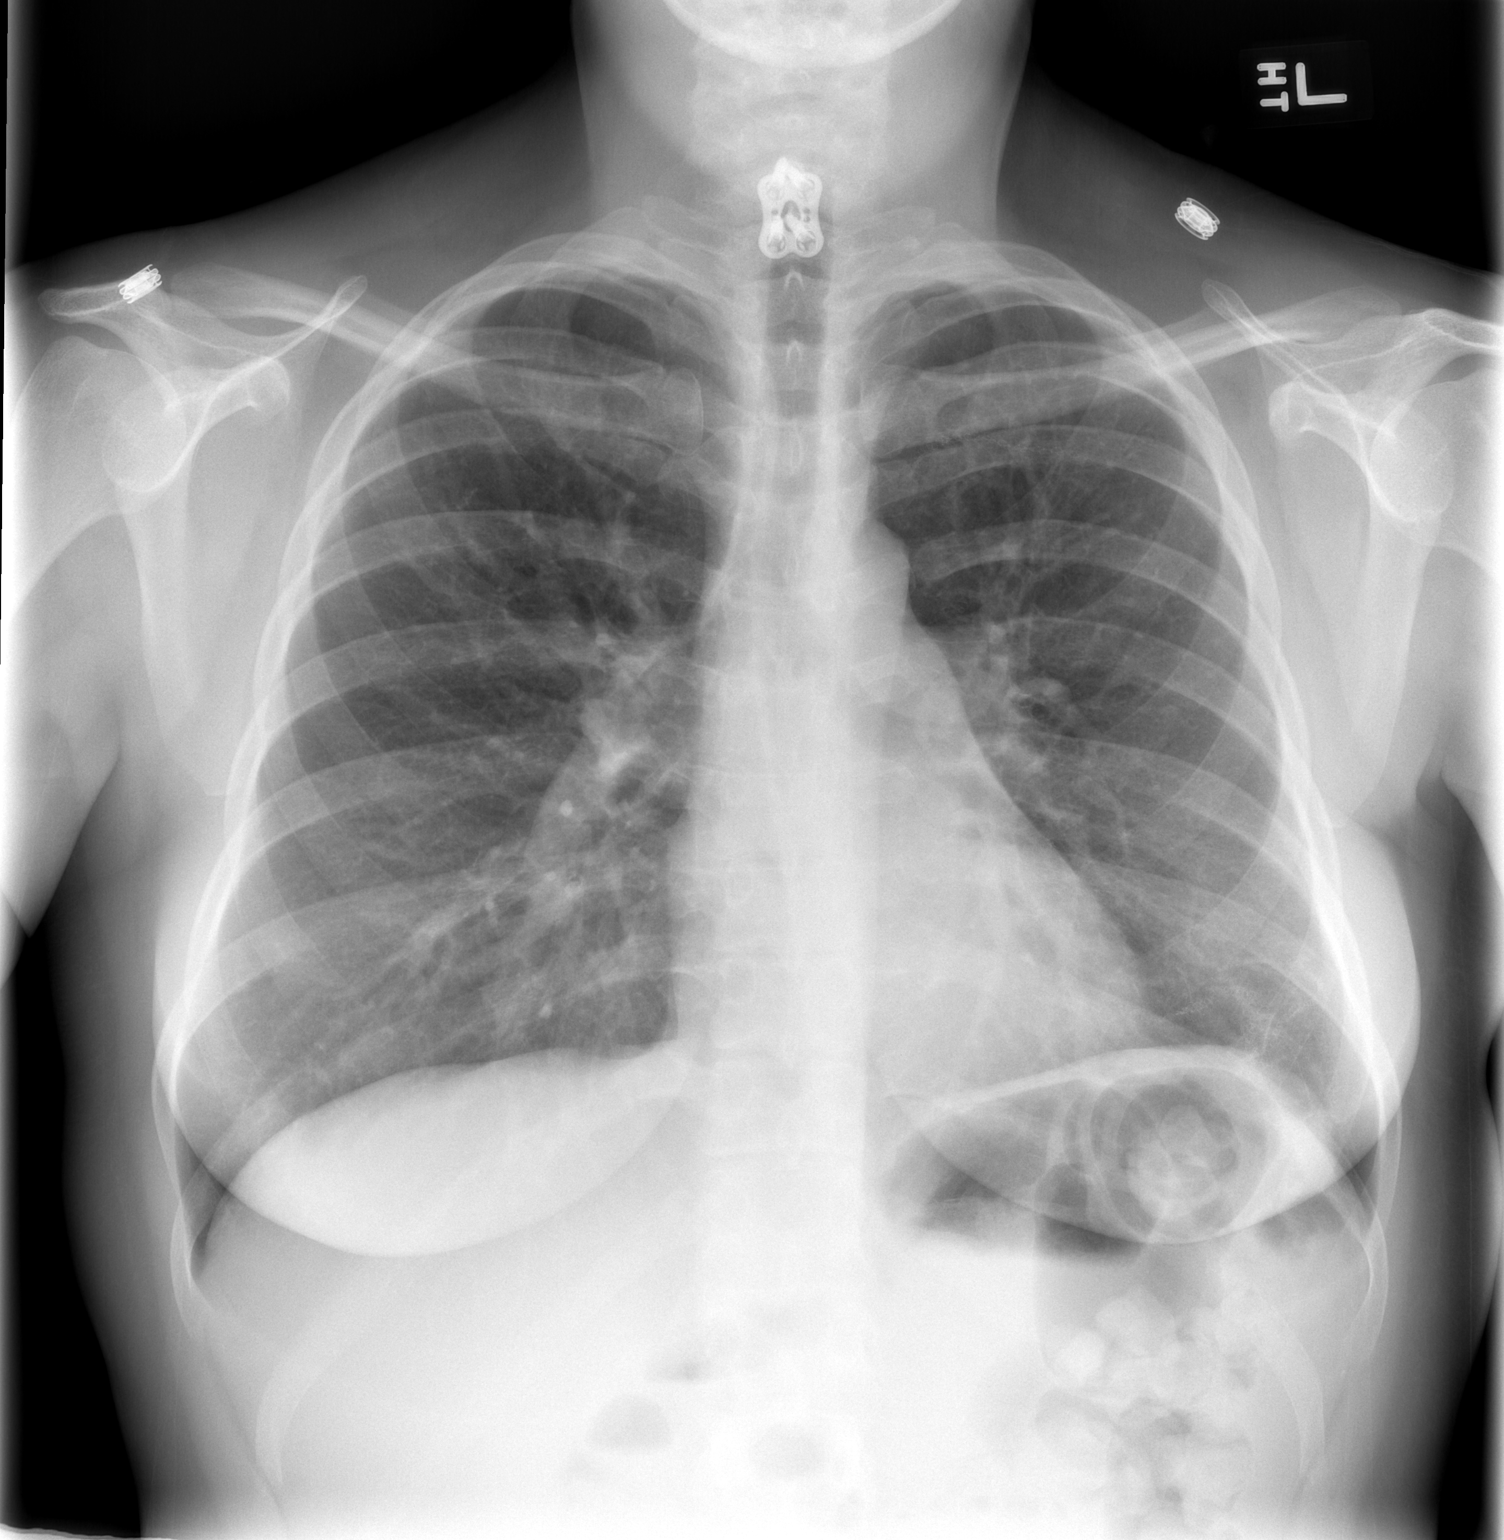

[w chest lat]
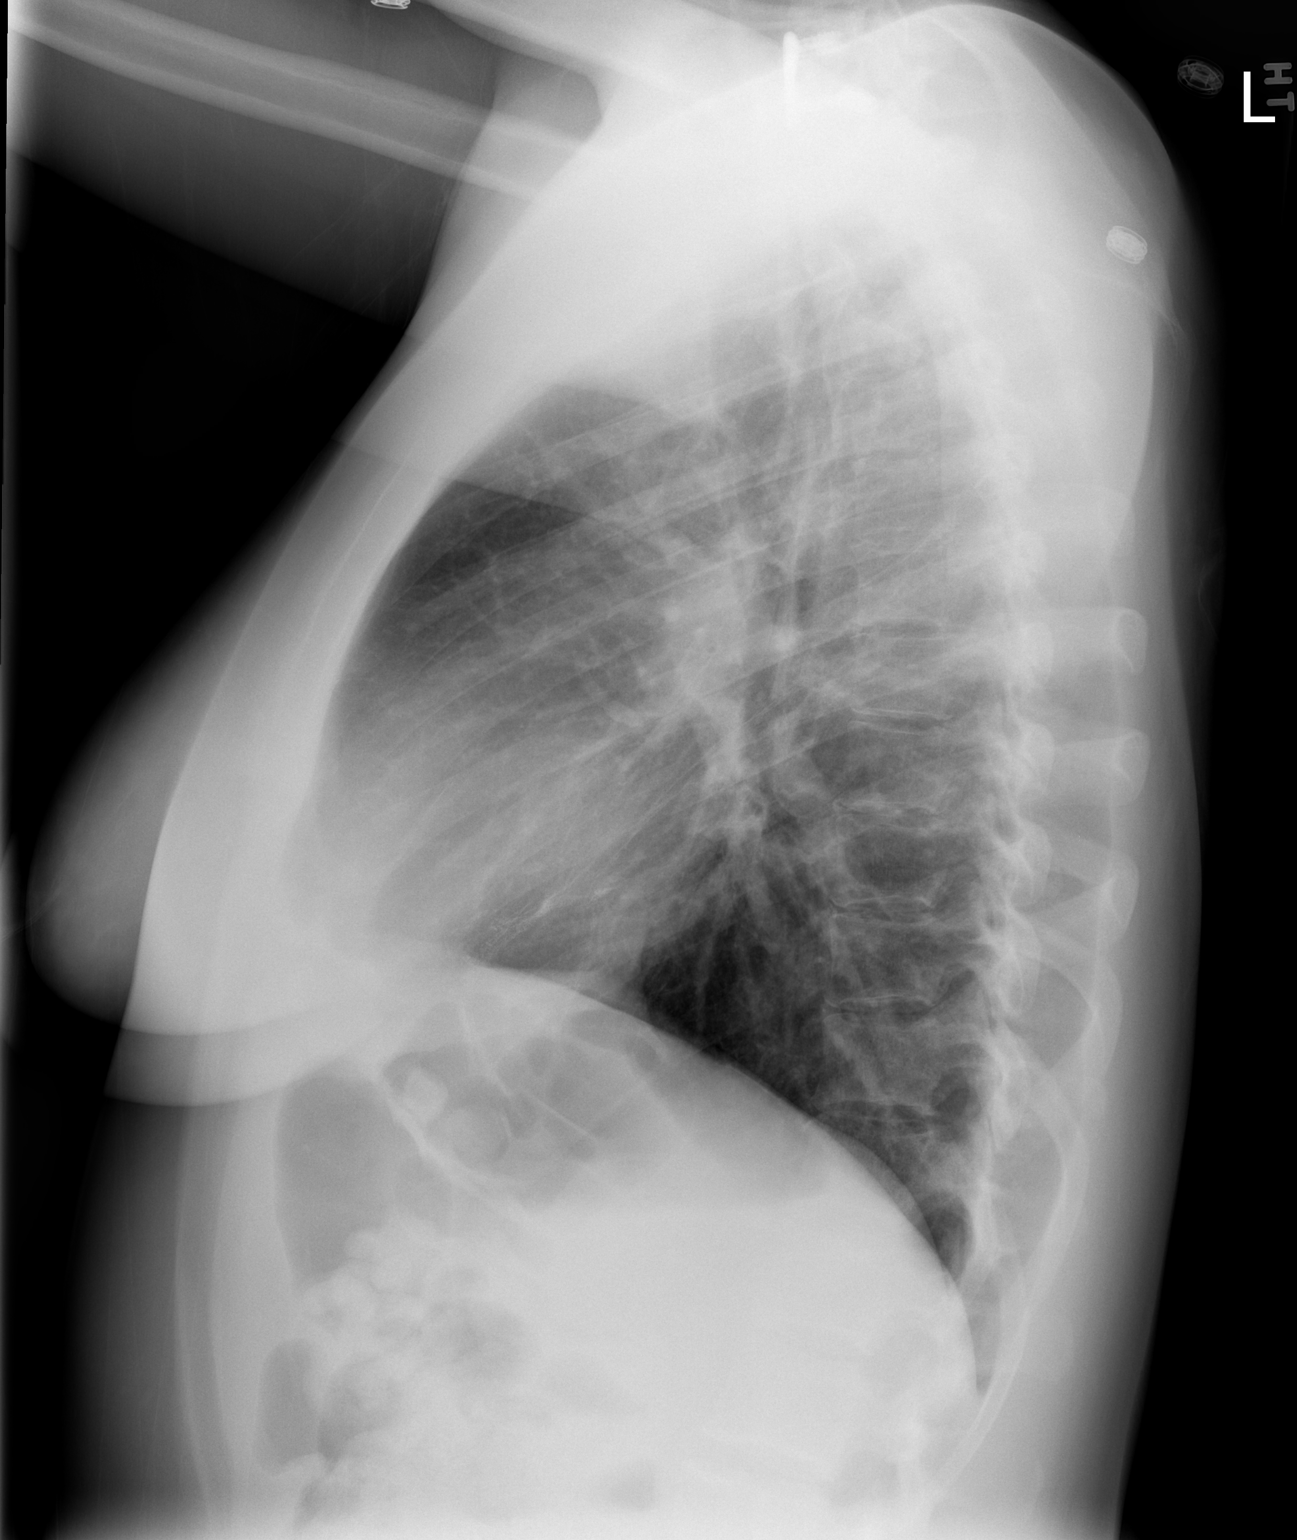

[2 of 2 positions shown; findings below may reference images not displayed]

FINDINGS: The heart size and vascularity are normal and the lungs
are clear except for scarring and staples at the left lung base.
No discrete adenopathy.  No effusions or infiltrates.  No acute
osseous abnormality.
IMPRESSION: No acute abnormalities.

## 2012-07-21 ENCOUNTER — Telehealth: Payer: Self-pay

## 2012-07-21 NOTE — Telephone Encounter (Signed)
Pt called and needs to speak with someone regarding mental health issues. Pt states she is not depressed or suicidal, but just really confused. She has scheduled an appt with dr Cleta Alberts in September, but feels medication may need adjusted and cant wait that long to address issues Please call pt to advise

## 2012-07-21 NOTE — Telephone Encounter (Signed)
She is far overdue for a follow up. She must come in for medication adjustment. Called her to advise. She does have a therapist, has spoken to her. 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 She states she can not pay her bills and can not work., she states she is wanting to start new medications, advised again we can not start any new medications without an office visit.

## 2012-07-25 ENCOUNTER — Other Ambulatory Visit: Payer: Self-pay

## 2012-07-25 ENCOUNTER — Other Ambulatory Visit: Payer: Self-pay | Admitting: Physician Assistant

## 2012-07-25 MED ORDER — AMPHETAMINE-DEXTROAMPHET ER 20 MG PO CP24: 40.0000 mg | ORAL_CAPSULE | ORAL | Status: DC

## 2012-07-25 NOTE — Telephone Encounter (Signed)
Patient called, left message saying she would like to get a refill on Adderall.  She will pick up the Rx when it's ready.  Call back number [phone].

## 2012-07-26 ENCOUNTER — Other Ambulatory Visit: Payer: Self-pay | Admitting: Physician Assistant

## 2012-07-28 ENCOUNTER — Telehealth: Payer: Self-pay

## 2012-07-28 NOTE — Telephone Encounter (Signed)
Pharm requests RF of alprazolam 0.5 mg. Pt has been advised she needs OV (see phone message 07/21/12), and 1 mos Zoloft was filled 07/25/12 w/note needs ov for more. Do you want to give pt a RF of alprazolam or does she need to [facility] for RF?

## 2012-07-29 ENCOUNTER — Other Ambulatory Visit: Payer: Self-pay | Admitting: Radiology

## 2012-07-29 NOTE — Telephone Encounter (Signed)
From OV note Dec 2013: Assessment & Plan:   R AOM  Sarcoidosis-stable  Anxiety and depression with good support system-she is willing to try zoloft again since she took it previously without any problems. She needs a RF on her xanax-she hasn't had a prescription since the summer for that. She is to see me or Dr. Cleta Alberts in 4-6 weeks. Sooner if worse or any problems. Spent >45 mins face to face with patient.  Pt is way overdue for OV.  Needs visit before we can provide refills, esp on a controlled substance.

## 2012-07-29 NOTE — Telephone Encounter (Signed)
Sent denial 

## 2012-08-07 ENCOUNTER — Telehealth: Payer: Self-pay | Admitting: Emergency Medicine

## 2012-08-07 NOTE — Telephone Encounter (Signed)
Patient calls with complaint of right upper abdominal pain and distention. She has had a cholecystectomy. I advised the patient with those symptoms it would be more appropriate that she go to the emergency room so they could do the appropriate testing to figure out what's going on with her. Patient voiced understanding. With her and told her to be working Friday, Saturday, and Sunday and would be happy to followup on what they find.

## 2012-09-02 ENCOUNTER — Other Ambulatory Visit: Payer: Self-pay | Admitting: Physician Assistant

## 2012-09-10 ENCOUNTER — Other Ambulatory Visit: Payer: Self-pay

## 2012-09-10 MED ORDER — AMPHETAMINE-DEXTROAMPHET ER 20 MG PO CP24: 40.0000 mg | ORAL_CAPSULE | ORAL | Status: DC

## 2012-09-10 NOTE — Telephone Encounter (Signed)
Patient came into the office wanting to know if her Rx was ready for pick up.  Says she never called [place] to say she needed it refilled, but thought we may have it ready  Since she was in the area.  Front desk advised her we will get a message to the provider for refill and will call her when Rx is ready.  She asked if she could take some of her sons medication until the Rx is ready.  I said I would not advise that she take anyone else's medication.  She verbalized understanding.

## 2012-09-11 NOTE — Telephone Encounter (Signed)
Rx signed, ready for pick up.  I called the patient.  She is aware.  

## 2012-09-23 ENCOUNTER — Ambulatory Visit: Payer: 59

## 2012-09-23 ENCOUNTER — Ambulatory Visit (INDEPENDENT_AMBULATORY_CARE_PROVIDER_SITE_OTHER): Payer: 59 | Admitting: Emergency Medicine

## 2012-09-23 VITALS — BP 110/74 | HR 112 | Temp 99.4°F | Resp 16 | Ht 64.5 in | Wt 131.8 lb

## 2012-09-23 DIAGNOSIS — F329 Major depressive disorder, single episode, unspecified: Secondary | ICD-10-CM

## 2012-09-23 DIAGNOSIS — Z720 Tobacco use: Secondary | ICD-10-CM

## 2012-09-23 DIAGNOSIS — R413 Other amnesia: Secondary | ICD-10-CM

## 2012-09-23 DIAGNOSIS — D869 Sarcoidosis, unspecified: Secondary | ICD-10-CM

## 2012-09-23 DIAGNOSIS — R05 Cough: Secondary | ICD-10-CM

## 2012-09-23 DIAGNOSIS — F17200 Nicotine dependence, unspecified, uncomplicated: Secondary | ICD-10-CM

## 2012-09-23 DIAGNOSIS — R000 Tachycardia, unspecified: Secondary | ICD-10-CM

## 2012-09-23 MED ORDER — SERTRALINE HCL 100 MG PO TABS: ORAL_TABLET | ORAL | Status: DC

## 2012-09-23 NOTE — Progress Notes (Signed)
  Subjective:    Patient ID: [name], female    DOB: [birth year], 37 y.o.   MRN: [id number]  HPI patient arrived because she stated she needed a refill on her Zoloft. She has been taking this for depression. She states she is smoking regularly at least a pack a day and also smoking marijuana frequently through the day. She states she has posttraumatic stress disorder following the suicide death of her husband last year. She states she has been to therapist  but has not been seeing them regularly she states she has been not working. She's been raising her 2 children. 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 She is interested in being referred to [facility] where she can be seen and evaluated to consider medical marijuana. She currently is under the care of [facility] where she is followed for ADD and memory issues..    Review of Systems     Objective:   Physical Exam patient was alert and cooperative but very anxious during the encounter. Her neck is supple without thyromegaly her chest exam reveals a few basilar rhonchi but was otherwise clear heart was regular rate without murmurs abdomen was soft nontender. [facility] reading (PRIMARY) by  Dr. Delorise Royals is mild scarring in the right base otherwise clear. There is an increased AP diameter  .       Assessment & Plan:    Patient seemed very hyperactive during the encounter today . She rearranges the equipment in the room. She refused to put on a gown for her chest x-ray when this was requested by the x-ray technician. She asked if she could have samples of the probiotics that were on the counter when she walked by. She then refused to stay for her pulmonary function test and blood work because she needed to go to [employer]. She agreed to return in the morning for blood work and her pulmonary function tests. She requested her Adderall prescription but I told her we did not write for that in this office and that  was prescribed by her neurologist. I did refill her Zoloft which she has been on but did not write any other prescriptions. When she returns tomorrow I am going to get the name of her therapist and call her therapist to see if she has better insight into what is going on in Jeff's life . I have seen [name] depressed in the past but I have not seen her with this kind of behavior.

## 2012-09-29 ENCOUNTER — Telehealth: Payer: Self-pay | Admitting: Radiology

## 2012-09-29 NOTE — Telephone Encounter (Signed)
Letter sent to patient.

## 2012-10-16 ENCOUNTER — Ambulatory Visit: Payer: 59 | Admitting: Nurse Practitioner

## 2012-10-22 ENCOUNTER — Other Ambulatory Visit: Payer: Self-pay

## 2012-10-22 MED ORDER — AMPHETAMINE-DEXTROAMPHET ER 20 MG PO CP24: 40.0000 mg | ORAL_CAPSULE | ORAL | Status: DC

## 2012-10-22 NOTE — Telephone Encounter (Signed)
Patient called requesting a refill on Adderall.  She would like to pick up the Rx when it's ready.  Call back number [phone]

## 2012-10-23 ENCOUNTER — Ambulatory Visit (INDEPENDENT_AMBULATORY_CARE_PROVIDER_SITE_OTHER): Payer: 59 | Admitting: Nurse Practitioner

## 2012-10-23 ENCOUNTER — Encounter: Payer: Self-pay | Admitting: Nurse Practitioner

## 2012-10-23 ENCOUNTER — Encounter (INDEPENDENT_AMBULATORY_CARE_PROVIDER_SITE_OTHER): Payer: Self-pay

## 2012-10-23 VITALS — BP 109/70 | HR 85 | Ht 65.0 in | Wt 133.0 lb

## 2012-10-23 DIAGNOSIS — F988 Other specified behavioral and emotional disorders with onset usually occurring in childhood and adolescence: Secondary | ICD-10-CM

## 2012-10-23 MED ORDER — LISDEXAMFETAMINE DIMESYLATE 30 MG PO CAPS: 30.0000 mg | ORAL_CAPSULE | ORAL | Status: DC

## 2012-10-23 NOTE — Patient Instructions (Signed)
Will switch to Vyvanse 30mg  daily for now RX given F/U 6 months to 1 year

## 2012-10-23 NOTE — Progress Notes (Signed)
I have read the note, and I agree with the clinical assessment and plan.  WILLIS,CHARLES KEITH   

## 2012-10-23 NOTE — Progress Notes (Signed)
[facility]  PATIENT: [name] DOB: Jun 07, 1975   REASON FOR VISIT:follow up for ADD   HISTORY OF PRESENT ILLNESS:[name]  is a 37 year old right-handed white female with a history of ADD, and a perceived memory problem. The patient has had a lifelong issue with this, and she is  on Adderall. She has found this medication to be quite helpful in the past however today she claims she stopped Adderall and is taking her sons  Vyvanse which is working better. She says this  is quite helpful. The patient feels as if she is better organized, and is functioning better. The patient claims that she is not on meds for  sarcoidosis, previously on  methotrexate and prednisone dose.   The sleep study was never done secondary to lack of insurance coverage. She claims she is continuing to see a Veterinary surgeon, she is asking about getting in a research study at [facility] with medical marijuana. She claims she smokes marijuana on a daily basis.  10/25/11: Patient returns for followup. Since last seen she has been taken off her Prednisone, methotrexate and Plaquenil due to decreased renal function. She says her husband committed suicide last month, she has had more problems focusing and wants her Adderall changed to XR because she is forgetting to take the second dose of the day.    REVIEW OF SYSTEMS: Full 14 system review of systems performed and notable only for:  Constitutional: N/A  Cardiovascular: N/A  Ear/Nose/Throat: N/A  Skin: N/A  Eyes: N/A  Respiratory: N/A  Gastroitestinal: N/A  Hematology/Lymphatic: Anemia  Endocrine: N/A Musculoskeletal:N/A  Allergy/Immunology: N/A  Neurological: N/A Psychiatric: Anxiety  ALLERGIES: Allergies  Allergen Reactions  . Lyrica [Pregabalin] Other (See Comments)    Hallucinations    HOME MEDICATIONS: Outpatient Prescriptions Prior to Visit  Medication Sig Dispense Refill  . ALPRAZolam (XANAX) 0.5 MG tablet 1/2 tablet in am,  1/2 tablet in afternoon, 1 tablet at night  60 tablet  0  . amphetamine-dextroamphetamine (ADDERALL XR) 20 MG 24 hr capsule Take 2 capsules (40 mg total) by mouth every morning.  60 capsule  0  . furosemide (LASIX) 40 MG tablet Take 1 tablet (40 mg total) by mouth daily.  90 tablet  0  . sertraline (ZOLOFT) 100 MG tablet TAKE 1 TABLET (100 MG TOTAL) BY MOUTH DAILY.  30 tablet  11  . albuterol (PROVENTIL HFA;VENTOLIN HFA) 108 (90 BASE) MCG/ACT inhaler Inhale into the lungs every 6 (six) hours as needed.      Marland Kitchen amoxicillin (AMOXIL) 500 MG capsule Take 2 bid  40 capsule  0  . Vitamin D, Ergocalciferol, (DRISDOL) 50000 UNITS CAPS Take 1 capsule (50,000 Units total) by mouth every 7 (seven) days.  12 capsule  1   No facility-administered medications prior to visit.    PAST MEDICAL HISTORY: Past Medical History  Diagnosis Date  . Sarcoidosis   . Hypertension   . Anxiety   . ADD (attention deficit disorder)     PAST SURGICAL HISTORY: No past surgical history on file.  FAMILY HISTORY: Family History  Problem Relation Age of Onset  . ADD / ADHD Son   . Healthy Father     SOCIAL HISTORY: History   Social History  . Marital Status: Widowed    Spouse Name: N/A    Number of Children: 2  . Years of Education: Associates   Occupational History  . RN    Social History Main Topics  . Smoking status:  Former Smoker -- 1.00 packs/day    Quit date: 03/07/2011  . Smokeless tobacco: Never Used  . Alcohol Use: No  . Drug Use: No  . Sexual Activity: Not on file   Other Topics Concern  . Not on file   Social History Narrative   The patient is widowed.    Patient husband committed suicide.    Patient has 2 children.    Patient has an Associates degree.      PHYSICAL EXAM  Filed Vitals:   10/23/12 1020  BP: 109/70  Pulse: 85  Height: 5\' 5"  (1.651 m)  Weight: 133 lb (60.328 kg)   Body mass index is 22.13 kg/(m^2).  Generalized: Well developed, in no acute distress    Neurological examination   Mentation: Alert oriented to time, place, history taking. Follows all commands speech and language fluent  Cranial nerve II-XII: Pupils were equal round reactive to light extraocular movements were full, visual field were full on confrontational test. Facial sensation and strength were normal. hearing was intact to finger rubbing bilaterally. Uvula tongue midline. head turning and shoulder shrug and were normal and symmetric.Tongue protrusion into cheek strength was normal. Motor: normal bulk and tone, full strength in the BUE, BLE, fine finger movements normal, no pronator drift. No focal weakness Sensory: normal and symmetric to light touch, pinprick, and  vibration  Coordination: finger-nose-finger, heel-to-shin bilaterally, no dysmetria Reflexes: Brachioradialis 2/2, biceps 2/2, triceps 2/2, patellar 2/2, Achilles 2/2, plantar responses were flexor bilaterally. Gait and Station: Rising up from seated position without assistance, normal stance, without trunk ataxia, moderate stride, good arm swing, smooth turning, able to perform tiptoe, and heel walking without difficulty. Tandem gait is steady  DIAGNOSTIC DATA (LABS, IMAGING, TESTING) None to review        ASSESSMENT AND PLAN  37 y.o. year old female  has a past medical history of Sarcoidosis; Hypertension; Anxiety; and ADD (attention deficit disorder). here for followup. She claims she recently stopped her Adderall  and is using her sons  Vyvanse.   Will switch to Vyvanse 30mg  daily for now, will increase dose later if needed RX given She is to continue counseling with Maree Erie F/U 6 months to 1 year  Nilda Riggs, [facility], [facility], APRN  [facility] [address] [place], [place] [address] [phone]

## 2012-10-30 DIAGNOSIS — Z0271 Encounter for disability determination: Secondary | ICD-10-CM

## 2012-12-05 ENCOUNTER — Encounter: Payer: Self-pay | Admitting: Nurse Practitioner

## 2012-12-16 ENCOUNTER — Telehealth: Payer: Self-pay

## 2012-12-16 NOTE — Telephone Encounter (Signed)
Patient called, left message requesting a refill on Adderall XR 2 daily.  Last OV says the patient was changed to Vyvanse because she had trouble remembering to take second daily dose of Adderall.  As well, said she stopped taking Adderall and started taking her son's Vyvanse.  We need to clarify if the patient wishes to change back to Adderall or if she wants to continue Vyvanse.  I called the patient back.  Got no answer.  Left message asking for a return call.

## 2012-12-17 ENCOUNTER — Telehealth: Payer: Self-pay

## 2012-12-17 MED ORDER — AMPHETAMINE-DEXTROAMPHET ER 20 MG PO CP24: 40.0000 mg | ORAL_CAPSULE | Freq: Every day | ORAL | Status: DC

## 2012-12-17 NOTE — Telephone Encounter (Signed)
Will switch the patient back to Adderall, taking 20 mg, 2 in the morning.

## 2012-12-17 NOTE — Telephone Encounter (Signed)
Pt called around 6:00pm or 615pm telling me that she was being invountarily against her will and wanted to talk with dr Cleta Alberts which left at 400.  I put patient on hold and went to ask clinical what to tell patient and tracy tried to get on the phone but when we picked up patient was no longer on the line and has not called back .

## 2012-12-17 NOTE — Telephone Encounter (Signed)
Spoke with deputy and states she was served with involuntary commitment papers earlier today. She is being treated at [facility]. He called to make sure she was still there and she was.

## 2012-12-17 NOTE — Telephone Encounter (Signed)
Patient called back.  Said she no longer wants to take Vyvanse, and would like to be changed back to Adderall XR 20mg  one bid, as she feels Adderall works better.  Please advise.  Thank you.

## 2012-12-17 NOTE — Telephone Encounter (Signed)
I called and left a VM that Rx will be at front desk for pick up in the a.m.

## 2012-12-17 NOTE — Telephone Encounter (Signed)
When French [name] talked to [name] she was unaware of any concerns

## 2012-12-17 NOTE — Telephone Encounter (Signed)
French Ana tried calling number patient called from. Her and I tried calling all numbers listed in chart and emergency contact which is Dondra Spry (aunt). I called 911 so they could send someone to her address and check on her. Info was given to 911 and they dispatched. I asked for a return call after someone checked on her. As of 813pm no one has called back with details.

## 2013-01-01 ENCOUNTER — Ambulatory Visit: Payer: Self-pay

## 2013-02-27 ENCOUNTER — Telehealth: Payer: Self-pay | Admitting: Neurology

## 2013-02-27 NOTE — Telephone Encounter (Signed)
Needs written Rx for Adderrall--has no telephone--will callback to see if ready and will pick up

## 2013-03-02 ENCOUNTER — Other Ambulatory Visit: Payer: Self-pay | Admitting: Neurology

## 2013-03-02 MED ORDER — AMPHETAMINE-DEXTROAMPHET ER 20 MG PO CP24: 40.0000 mg | ORAL_CAPSULE | Freq: Every day | ORAL | Status: DC

## 2013-03-02 NOTE — Telephone Encounter (Signed)
Patient stop be to pick up her Rx today.

## 2013-04-20 ENCOUNTER — Telehealth: Payer: Self-pay | Admitting: Neurology

## 2013-04-20 MED ORDER — AMPHETAMINE-DEXTROAMPHET ER 20 MG PO CP24: 40.0000 mg | ORAL_CAPSULE | Freq: Every day | ORAL | Status: DC

## 2013-04-20 NOTE — Telephone Encounter (Signed)
Pt calling requesting refill on Adderall. Please advise

## 2013-04-20 NOTE — Telephone Encounter (Signed)
I will refill the Adderall. 

## 2013-04-20 NOTE — Addendum Note (Signed)
Addended by: Margette Fast on: 04/20/2013 06:08 PM   Modules accepted: Orders

## 2013-04-21 NOTE — Telephone Encounter (Signed)
Called pt to inform her that her Rx was ready to be picked up at the front desk and if she has any other problems, questions or concerns to call the office. Pt verbalized understanding. °

## 2013-05-21 ENCOUNTER — Telehealth: Payer: Self-pay

## 2013-05-21 NOTE — Telephone Encounter (Signed)
[employer] faxed request for records. Employee states that request was faxed to [place] with the wrong birthday and that [facility] sent them something back asking them to please verify the birthday. Employee called back to confirm that the correct birthday is [birth year] and she wants the records faxed. I could not locate the form and asked her to fax [place] a new one.

## 2013-05-22 NOTE — Telephone Encounter (Signed)
Second request received. Records faxed with confirmation to [facility] at [phone].

## 2013-06-30 ENCOUNTER — Other Ambulatory Visit: Payer: Self-pay | Admitting: Nurse Practitioner

## 2013-06-30 MED ORDER — AMPHETAMINE-DEXTROAMPHET ER 20 MG PO CP24: 40.0000 mg | ORAL_CAPSULE | Freq: Every day | ORAL | Status: DC

## 2013-06-30 NOTE — Telephone Encounter (Signed)
Called pt to inform her that her Rx was ready to be picked up at the front desk and if she has any other problems, questions or concerns to call the office. Pt verbalized understanding. °

## 2013-06-30 NOTE — Telephone Encounter (Signed)
Request forwarded to [employer] Bruney for approval  

## 2013-06-30 NOTE — Telephone Encounter (Signed)
Patient calling to request Adderall refill, states that she does not have a vehicle but will have a ride today and is wondering if she can pick it up today. Please call patient and advise.

## 2013-07-10 ENCOUNTER — Telehealth: Payer: Self-pay

## 2013-07-10 NOTE — Telephone Encounter (Signed)
The note would be fine. He can mail that to her. #2 she can have 3 months of the Zoloft refilled but needs to make an appointment to be seen at 104 for further followup this

## 2013-07-10 NOTE — Telephone Encounter (Signed)
DAUB - PT needs a note for window tinting above state guidelines for vision and skin purposes. The tint is  55/20.  Pt says she does not take phone calls and wants [place] to mail her.  Also wants Zoloft renewed. Wants you to know she is doing well and that her paranoia is doing better.  She came into the office about this.

## 2013-07-14 ENCOUNTER — Encounter: Payer: Self-pay | Admitting: *Deleted

## 2013-07-14 MED ORDER — SERTRALINE HCL 100 MG PO TABS: 100.0000 mg | ORAL_TABLET | Freq: Every day | ORAL | Status: AC

## 2013-07-14 NOTE — Telephone Encounter (Signed)
Sent in refill on her Zoloft to [place]. Wrote letter to [facility] and mailed to pt with a note advising pt to make an appt before her script runs out.

## 2013-07-21 ENCOUNTER — Telehealth: Payer: Self-pay

## 2013-07-21 NOTE — Telephone Encounter (Signed)
DOCUMENTATION ONLY - PT CAME IN FIRST SPEAKING TO JASMINE, THEN TO MYSELF.  PT STATES THAT WHEN SHE CAME IN LAST TIME SHE HAD REQUESTED A LETTER FOR MEDICAL MARIJUANA FROM DR. DAUB.  JASMINE INQUIRED ON THIS AND PT BECAME VERY AGITATED WHEN SHE SAW THAT THERE WAS NO NOTES ABOUT THIS IN EPIC.  JASMINE THEN CALLED FOR DAUB AT [address] WHO ALSO KNEW NOTHING ABOUT IT.  WHEN I FOUND OUT WHAT WAS GOING ON I HAD THE PT COME TO ME AND SHE RECOGNIZED THE FACT THAT I WAS THE ONE TO TAKE HER MESSAGE IN June.  THE LETTER SHE HAD TOLD ME TO ASK ABOUT WAS FOR WINDOW TINTING, NOTHING ABOUT MEDICAL MARIJUANA.  PT SAID I WAS LYING, THAT I DIDN'T TAKE HER MESSAGE CORRECTLY, WAS EXTREMELY AGGRAVATED, WAS MOCKING ME AND WAS SAYING MY ENTIRE NAME OUT BY LOOKING AT MY BADGE.  SHE THEN STATED SHE WAS GOING TO WRITE IT DOWN AND I HANDED HER A NOTE PAD AND TOLD HER TO PLEASE DO SO.  DR DAUB ADVISED OVER THE PHONE THAT I PUSH THE PANIC BUTTON.  PT LEFT UNHAPPY AFTER I ADVISED HER FOR A THIRD TIME THAT DR DAUB NOR ANY OF OUR DOCTORS WRITE LETTERS FOR MEDICAL MARIJUANA USE.

## 2013-07-21 NOTE — Telephone Encounter (Signed)
Dr Everlene Farrier, you wanted me to call [facility], I did and held for several minutes, but never got to a person. I have reviewed chart, does not appear patient has been in treatment facility, but we have had records requested from detention center in [facility]. I think she has been or is about to be incarcerated. This may be why she is requesting letter for medical marijuana. Dr Jannifer Franklin is prescribing the Adderall.

## 2013-08-11 ENCOUNTER — Other Ambulatory Visit: Payer: Self-pay | Admitting: Nurse Practitioner

## 2013-08-11 MED ORDER — AMPHETAMINE-DEXTROAMPHET ER 20 MG PO CP24: 40.0000 mg | ORAL_CAPSULE | Freq: Every day | ORAL | Status: DC

## 2013-08-11 NOTE — Telephone Encounter (Signed)
Called pt and left message informing her that her Rx was ready to be picked up at the front desk and if she has any other problems, questions or concerns to call the office.  °

## 2013-08-11 NOTE — Telephone Encounter (Signed)
Patient requesting refill of Adderall

## 2013-08-11 NOTE — Telephone Encounter (Signed)
Request forwarded to provider for approval  

## 2013-08-17 ENCOUNTER — Encounter: Payer: Self-pay | Admitting: Nurse Practitioner

## 2013-09-08 ENCOUNTER — Other Ambulatory Visit: Payer: Self-pay | Admitting: *Deleted

## 2013-09-08 MED ORDER — AMPHETAMINE-DEXTROAMPHET ER 20 MG PO CP24: 40.0000 mg | ORAL_CAPSULE | Freq: Every day | ORAL | Status: AC

## 2013-09-08 NOTE — Telephone Encounter (Signed)
Request forwarded to provider for approval  

## 2013-09-09 NOTE — Telephone Encounter (Signed)
Called and LMVM for pt that prescription ready for pick up.

## 2013-10-23 ENCOUNTER — Ambulatory Visit: Payer: 59 | Admitting: Nurse Practitioner

## 2013-11-05 ENCOUNTER — Telehealth: Payer: Self-pay | Admitting: *Deleted

## 2013-11-05 NOTE — Telephone Encounter (Signed)
I called the patient, unable to reach her, left a message. We may be of the change in medications that she takes the 15 mg Adderall twice daily. The patient not been seen in over one year. We will get a revisit for her before altering her medications.

## 2013-11-06 NOTE — Telephone Encounter (Signed)
Spoke to patient appointment scheduled for 11-10-13 with CM.

## 2013-11-10 ENCOUNTER — Telehealth: Payer: Self-pay | Admitting: *Deleted

## 2013-11-10 ENCOUNTER — Ambulatory Visit: Payer: Self-pay | Admitting: Nurse Practitioner

## 2013-11-10 NOTE — Telephone Encounter (Signed)
Tried calling patient but no answer and no voice mail set up. Was calling to get patient scheduled to see MM since CM is full. Will try again later.

## 2013-11-10 NOTE — Telephone Encounter (Signed)
Dr. Jannifer Franklin has already called the patient on 11/05/13 and said we would not make changes to adderal until she is seen. Please put her on a wait list for earlier appointment if she is available to do that.

## 2013-11-16 NOTE — Telephone Encounter (Signed)
Patient returning call to [facility], scheduled patient to be seen by [facility] on 11/18/13.

## 2013-11-18 ENCOUNTER — Ambulatory Visit: Payer: 59 | Admitting: Adult Health

## 2013-11-18 NOTE — Telephone Encounter (Signed)
Patient called and canceled appt today stating that she will not be coming back due to [place] being apart of [place]. She also stated that she will not be paying the 35 dollar no show fee.

## 2014-01-15 DEATH — deceased

## 2014-01-18 ENCOUNTER — Ambulatory Visit: Payer: 59 | Admitting: Nurse Practitioner

## 2014-10-19 ENCOUNTER — Encounter: Payer: Self-pay | Admitting: Emergency Medicine

## 2014-11-11 NOTE — Telephone Encounter (Signed)
Error
